# Patient Record
Sex: Female | Born: 1990 | Race: White | Hispanic: No | Marital: Single | Smoking: Never smoker
Health system: Southern US, Community
[De-identification: ages and names within clinical notes are randomized; demographics above are authoritative.]

## PROBLEM LIST (undated history)

## (undated) ENCOUNTER — Inpatient Hospital Stay (HOSPITAL_COMMUNITY): Payer: Self-pay

## (undated) DIAGNOSIS — R1115 Cyclical vomiting syndrome unrelated to migraine: Secondary | ICD-10-CM

## (undated) DIAGNOSIS — F1290 Cannabis use, unspecified, uncomplicated: Secondary | ICD-10-CM

## (undated) DIAGNOSIS — F411 Generalized anxiety disorder: Secondary | ICD-10-CM

## (undated) DIAGNOSIS — F410 Panic disorder [episodic paroxysmal anxiety] without agoraphobia: Secondary | ICD-10-CM

## (undated) HISTORY — PX: NO PAST SURGERIES: SHX2092

---

## 2014-09-10 ENCOUNTER — Ambulatory Visit: Payer: 59 | Admitting: Podiatry

## 2014-12-30 ENCOUNTER — Emergency Department (HOSPITAL_COMMUNITY)
Admission: EM | Admit: 2014-12-30 | Discharge: 2014-12-30 | Disposition: A | Discharge status: Home / Self Care | Payer: 59 | Attending: Emergency Medicine | Admitting: Emergency Medicine

## 2014-12-30 ENCOUNTER — Encounter (HOSPITAL_COMMUNITY): Payer: Self-pay | Admitting: Nurse Practitioner

## 2014-12-30 DIAGNOSIS — Z331 Pregnant state, incidental: Secondary | ICD-10-CM | POA: Diagnosis not present

## 2014-12-30 DIAGNOSIS — K5900 Constipation, unspecified: Secondary | ICD-10-CM | POA: Insufficient documentation

## 2014-12-30 DIAGNOSIS — F419 Anxiety disorder, unspecified: Secondary | ICD-10-CM | POA: Diagnosis not present

## 2014-12-30 DIAGNOSIS — E860 Dehydration: Secondary | ICD-10-CM | POA: Diagnosis not present

## 2014-12-30 DIAGNOSIS — Z79899 Other long term (current) drug therapy: Secondary | ICD-10-CM | POA: Diagnosis not present

## 2014-12-30 DIAGNOSIS — R112 Nausea with vomiting, unspecified: Secondary | ICD-10-CM | POA: Insufficient documentation

## 2014-12-30 HISTORY — DX: Generalized anxiety disorder: F41.1

## 2014-12-30 LAB — URINALYSIS, ROUTINE W REFLEX MICROSCOPIC
Glucose, UA: 100 mg/dL — AB
Hgb urine dipstick: NEGATIVE
Leukocytes, UA: NEGATIVE
NITRITE: NEGATIVE
PH: 7 (ref 5.0–8.0)
Protein, ur: 100 mg/dL — AB
SPECIFIC GRAVITY, URINE: 1.035 — AB (ref 1.005–1.030)
UROBILINOGEN UA: 1 mg/dL (ref 0.0–1.0)

## 2014-12-30 LAB — COMPREHENSIVE METABOLIC PANEL
ALT: 31 U/L (ref 14–54)
ANION GAP: 13 (ref 5–15)
AST: 38 U/L (ref 15–41)
Albumin: 4.6 g/dL (ref 3.5–5.0)
Alkaline Phosphatase: 61 U/L (ref 38–126)
BILIRUBIN TOTAL: 0.9 mg/dL (ref 0.3–1.2)
BUN: 8 mg/dL (ref 6–20)
CALCIUM: 9.8 mg/dL (ref 8.9–10.3)
CHLORIDE: 103 mmol/L (ref 101–111)
CO2: 20 mmol/L — ABNORMAL LOW (ref 22–32)
Creatinine, Ser: 0.78 mg/dL (ref 0.44–1.00)
Glucose, Bld: 186 mg/dL — ABNORMAL HIGH (ref 65–99)
Potassium: 3.5 mmol/L (ref 3.5–5.1)
SODIUM: 136 mmol/L (ref 135–145)
Total Protein: 7.8 g/dL (ref 6.5–8.1)

## 2014-12-30 LAB — CBC WITH DIFFERENTIAL/PLATELET
BASOS ABS: 0 10*3/uL (ref 0.0–0.1)
Basophils Relative: 0 % (ref 0–1)
Eosinophils Absolute: 0 10*3/uL (ref 0.0–0.7)
Eosinophils Relative: 0 % (ref 0–5)
HCT: 39.3 % (ref 36.0–46.0)
Hemoglobin: 14.1 g/dL (ref 12.0–15.0)
LYMPHS ABS: 0.9 10*3/uL (ref 0.7–4.0)
Lymphocytes Relative: 5 % — ABNORMAL LOW (ref 12–46)
MCH: 31.4 pg (ref 26.0–34.0)
MCHC: 35.9 g/dL (ref 30.0–36.0)
MCV: 87.5 fL (ref 78.0–100.0)
Monocytes Absolute: 0.2 10*3/uL (ref 0.1–1.0)
Monocytes Relative: 1 % — ABNORMAL LOW (ref 3–12)
NEUTROS ABS: 17.5 10*3/uL — AB (ref 1.7–7.7)
Neutrophils Relative %: 94 % — ABNORMAL HIGH (ref 43–77)
PLATELETS: 323 10*3/uL (ref 150–400)
RBC: 4.49 MIL/uL (ref 3.87–5.11)
RDW: 12.8 % (ref 11.5–15.5)
WBC: 18.6 10*3/uL — AB (ref 4.0–10.5)

## 2014-12-30 LAB — POC URINE PREG, ED: Preg Test, Ur: POSITIVE — AB

## 2014-12-30 LAB — I-STAT CG4 LACTIC ACID, ED
LACTIC ACID, VENOUS: 2.45 mmol/L — AB (ref 0.5–2.0)
Lactic Acid, Venous: 0.93 mmol/L (ref 0.5–2.0)

## 2014-12-30 LAB — URINE MICROSCOPIC-ADD ON

## 2014-12-30 MED ORDER — SODIUM CHLORIDE 0.9 % IV BOLUS (SEPSIS)
2000.0000 mL | Freq: Once | INTRAVENOUS | Status: AC
  Administered 2014-12-30: 2000 mL via INTRAVENOUS

## 2014-12-30 MED ORDER — ONDANSETRON 4 MG PO TBDP
ORAL_TABLET | ORAL | Status: AC
  Filled 2014-12-30: qty 2

## 2014-12-30 MED ORDER — ONDANSETRON 4 MG PO TBDP
8.0000 mg | ORAL_TABLET | Freq: Once | ORAL | Status: AC
  Administered 2014-12-30: 8 mg via ORAL

## 2014-12-30 MED ORDER — PROMETHAZINE HCL 25 MG PO TABS: 25.0000 mg | ORAL_TABLET | Freq: Four times a day (QID) | ORAL | Status: DC | PRN

## 2014-12-30 NOTE — ED Notes (Signed)
Pt stable, ambulatory, states understanding of discharge instructions 

## 2014-12-30 NOTE — ED Provider Notes (Signed)
CSN: [id number]     Arrival date & time 12/30/14  1526 History   First MD Initiated Contact with Patient 12/30/14 1815     Chief Complaint  Patient presents with  . Emesis     (Consider location/radiation/quality/duration/timing/severity/associated sxs/prior Treatment) HPI  24 year old female presents with severe vomiting since 6 AM. She thinks she has vomited more than 20 times. No blood or bile. No diarrhea, last bowel movement was over 24 hours ago. This is not atypical for her. She has had vomiting at this multiple times over the last 5 months since starting on an SSRI. Every time she seems to get on a new bottle a few days later she seems to get vomiting like this. Had epigastric abdominal pain while vomiting but now that she has stopped vomiting after being given Zofran in the waiting room she has no more pain. No lower abdominal pain or urinary symptoms. Last menstrual period was May 17.  Past Medical History  Diagnosis Date  . Generalized anxiety disorder    History reviewed. No pertinent past surgical history. History reviewed. No pertinent family history. History  Substance Use Topics  . Smoking status: Never Smoker   . Smokeless tobacco: Not on file  . Alcohol Use: No   OB History    No data available     Review of Systems  Constitutional: Negative for fever.  Gastrointestinal: Positive for nausea, vomiting, abdominal pain and constipation. Negative for diarrhea.  Genitourinary: Negative for dysuria and menstrual problem.  Musculoskeletal: Negative for back pain.  All other systems reviewed and are negative.     Allergies  Review of patient's allergies indicates no known allergies.  Home Medications   Prior to Admission medications   Medication Sig Start Date End Date Taking? Authorizing Provider  dimenhyDRINATE (DRAMAMINE) 50 MG tablet Take 50 mg by mouth every 8 (eight) hours as needed for nausea.   Yes Historical Provider, MD  escitalopram (LEXAPRO) 10 MG  tablet Take 10 mg by mouth at bedtime. 12/20/14  Yes Historical Provider, MD  RECLIPSEN 0.15-30 MG-MCG tablet Take 1 tablet by mouth daily. 11/05/14  Yes Historical Provider, MD   BP 159/62 mmHg  Pulse 91  Temp(Src) 98.2 F (36.8 C) (Oral)  Resp 18  SpO2 98% Physical Exam  Constitutional: She is oriented to person, place, and time. She appears well-developed and well-nourished.  HENT:  Head: Normocephalic and atraumatic.  Right Ear: External ear normal.  Left Ear: External ear normal.  Nose: Nose normal.  Dry mucous membranes  Eyes: Right eye exhibits no discharge. Left eye exhibits no discharge.  Cardiovascular: Normal rate, regular rhythm and normal heart sounds.   Pulmonary/Chest: Effort normal and breath sounds normal.  Abdominal: Soft. She exhibits no distension. There is no tenderness.  Neurological: She is alert and oriented to person, place, and time.  Skin: Skin is warm and dry.  Nursing note and vitals reviewed.   ED Course  Procedures (including critical care time) Labs Review Labs Reviewed  COMPREHENSIVE METABOLIC PANEL - Abnormal; Notable for the following:    CO2 20 (*)    Glucose, Bld 186 (*)    All other components within normal limits  CBC WITH DIFFERENTIAL/PLATELET - Abnormal; Notable for the following:    WBC 18.6 (*)    Neutrophils Relative % 94 (*)    Neutro Abs 17.5 (*)    Lymphocytes Relative 5 (*)    Monocytes Relative 1 (*)    All other components within normal limits  URINALYSIS, ROUTINE W REFLEX MICROSCOPIC (NOT AT [facility]) - Abnormal; Notable for the following:    Color, Urine AMBER (*)    APPearance HAZY (*)    Specific Gravity, Urine 1.035 (*)    Glucose, UA 100 (*)    Bilirubin Urine SMALL (*)    Ketones, ur >80 (*)    Protein, ur 100 (*)    All other components within normal limits  URINE MICROSCOPIC-ADD ON - Abnormal; Notable for the following:    Squamous Epithelial / LPF MANY (*)    Bacteria, UA FEW (*)    All other components within  normal limits  POC URINE PREG, ED - Abnormal; Notable for the following:    Preg Test, Ur POSITIVE (*)    All other components within normal limits  I-STAT CG4 LACTIC ACID, ED - Abnormal; Notable for the following:    Lactic Acid, Venous 2.45 (*)    All other components within normal limits  I-STAT CG4 LACTIC ACID, ED    Imaging Review No results found.   EKG Interpretation None      MDM   Final diagnoses:  Nausea and vomiting in adult  Dehydration    Patient with recurrent nausea and vomiting. Had epigastric pain while vomiting but currently has no pain and has not had lower abdominal pain. She is incidentally pregnant, last metro. Was just over one month ago. Given the lower abdominal symptoms and no vaginal bleeding and do not feel that she needs an emergent ultrasound. Her vomiting could be medication related versus gastroenteritis. She has no abdominal distention or tenderness to suggest needing CT or x-ray imaging. I discussed with her risks and benefits of drug use in pregnancy, she is not sure she was to keep the baby and thus wants a prescription for Phenergan. I did advise to start on prenatal vitamins and follow closely with OB/GYN. Initially had mild lactic acid elevation that was likely all dehydration, normalized after IV fluids. No more vomiting and tolerating by mouth here.    Pricilla LovelessScott Chosen Garron, MD 12/31/14 0001

## 2014-12-30 NOTE — ED Notes (Signed)
Pt reports onset vomiting at 0600 this am, unablee to tolerate any oral intake since. She was started on new SSRI 5 days ago. She is c/o abd pain from vomiting. She reports decreased bowel/bladder activity

## 2014-12-30 NOTE — Discharge Instructions (Signed)
Dehydration, Adult Dehydration is when you lose more fluids from the body than you take in. Vital organs like the kidneys, brain, and heart cannot function without a proper amount of fluids and salt. Any loss of fluids from the body can cause dehydration.  CAUSES   Vomiting.  Diarrhea.  Excessive sweating.  Excessive urine output.  Fever. SYMPTOMS  Mild dehydration  Thirst.  Dry lips.  Slightly dry mouth. Moderate dehydration  Very dry mouth.  Sunken eyes.  Skin does not bounce back quickly when lightly pinched and released.  Dark urine and decreased urine production.  Decreased tear production.  Headache. Severe dehydration  Very dry mouth.  Extreme thirst.  Rapid, weak pulse (more than 100 beats per minute at rest).  Cold hands and feet.  Not able to sweat in spite of heat and temperature.  Rapid breathing.  Blue lips.  Confusion and lethargy.  Difficulty being awakened.  Minimal urine production.  No tears. DIAGNOSIS  Your caregiver will diagnose dehydration based on your symptoms and your exam. Blood and urine tests will help confirm the diagnosis. The diagnostic evaluation should also identify the cause of dehydration. TREATMENT  Treatment of mild or moderate dehydration can often be done at home by increasing the amount of fluids that you drink. It is best to drink small amounts of fluid more often. Drinking too much at one time can make vomiting worse. Refer to the home care instructions below. Severe dehydration needs to be treated at the hospital where you will probably be given intravenous (IV) fluids that contain water and electrolytes. HOME CARE INSTRUCTIONS   Ask your caregiver about specific rehydration instructions.  Drink enough fluids to keep your urine clear or pale yellow.  Drink small amounts frequently if you have nausea and vomiting.  Eat as you normally do.  Avoid:  Foods or drinks high in sugar.  Carbonated  drinks.  Juice.  Extremely hot or cold fluids.  Drinks with caffeine.  Fatty, greasy foods.  Alcohol.  Tobacco.  Overeating.  Gelatin desserts.  Wash your hands well to avoid spreading bacteria and viruses.  Only take over-the-counter or prescription medicines for pain, discomfort, or fever as directed by your caregiver.  Ask your caregiver if you should continue all prescribed and over-the-counter medicines.  Keep all follow-up appointments with your caregiver. SEEK MEDICAL CARE IF:  You have abdominal pain and it increases or stays in one area (localizes).  You have a rash, stiff neck, or severe headache.  You are irritable, sleepy, or difficult to awaken.  You are weak, dizzy, or extremely thirsty. SEEK IMMEDIATE MEDICAL CARE IF:   You are unable to keep fluids down or you get worse despite treatment.  You have frequent episodes of vomiting or diarrhea.  You have blood or green matter (bile) in your vomit.  You have blood in your stool or your stool looks black and tarry.  You have not urinated in 6 to 8 hours, or you have only urinated a small amount of very dark urine.  You have a fever.  You faint. MAKE SURE YOU:   Understand these instructions.  Will watch your condition.  Will get help right away if you are not doing well or get worse. Document Released: 06/20/2005 Document Revised: 09/12/2011 Document Reviewed: 02/07/2011 [employer] Patient Information 2015 [employer]. This information is not intended to replace advice given to you by your health care provider. Make sure you discuss any questions you have with your health care   provider.  Nausea and Vomiting Nausea is a sick feeling that often comes before throwing up (vomiting). Vomiting is a reflex where stomach contents come out of your mouth. Vomiting can cause severe loss of body fluids (dehydration). Children and elderly adults can become dehydrated quickly, especially if they also have  diarrhea. Nausea and vomiting are symptoms of a condition or disease. It is important to find the cause of your symptoms. CAUSES   Direct irritation of the stomach lining. This irritation can result from increased acid production (gastroesophageal reflux disease), infection, food poisoning, taking certain medicines (such as nonsteroidal anti-inflammatory drugs), alcohol use, or tobacco use.  Signals from the brain.These signals could be caused by a headache, heat exposure, an inner ear disturbance, increased pressure in the brain from injury, infection, a tumor, or a concussion, pain, emotional stimulus, or metabolic problems.  An obstruction in the gastrointestinal tract (bowel obstruction).  Illnesses such as diabetes, hepatitis, gallbladder problems, appendicitis, kidney problems, cancer, sepsis, atypical symptoms of a heart attack, or eating disorders.  Medical treatments such as chemotherapy and radiation.  Receiving medicine that makes you sleep (general anesthetic) during surgery. DIAGNOSIS Your caregiver may ask for tests to be done if the problems do not improve after a few days. Tests may also be done if symptoms are severe or if the reason for the nausea and vomiting is not clear. Tests may include:  Urine tests.  Blood tests.  Stool tests.  Cultures (to look for evidence of infection).  X-rays or other imaging studies. Test results can help your caregiver make decisions about treatment or the need for additional tests. TREATMENT You need to stay well hydrated. Drink frequently but in small amounts.You may wish to drink water, sports drinks, clear broth, or eat frozen ice pops or gelatin dessert to help stay hydrated.When you eat, eating slowly may help prevent nausea.There are also some antinausea medicines that may help prevent nausea. HOME CARE INSTRUCTIONS   Take all medicine as directed by your caregiver.  If you do not have an appetite, do not force yourself to  eat. However, you must continue to drink fluids.  If you have an appetite, eat a normal diet unless your caregiver tells you differently.  Eat a variety of complex carbohydrates (rice, wheat, potatoes, bread), lean meats, yogurt, fruits, and vegetables.  Avoid high-fat foods because they are more difficult to digest.  Drink enough water and fluids to keep your urine clear or pale yellow.  If you are dehydrated, ask your caregiver for specific rehydration instructions. Signs of dehydration may include:  Severe thirst.  Dry lips and mouth.  Dizziness.  Dark urine.  Decreasing urine frequency and amount.  Confusion.  Rapid breathing or pulse. SEEK IMMEDIATE MEDICAL CARE IF:   You have blood or brown flecks (like coffee grounds) in your vomit.  You have black or bloody stools.  You have a severe headache or stiff neck.  You are confused.  You have severe abdominal pain.  You have chest pain or trouble breathing.  You do not urinate at least once every 8 hours.  You develop cold or clammy skin.  You continue to vomit for longer than 24 to 48 hours.  You have a fever. MAKE SURE YOU:   Understand these instructions.  Will watch your condition.  Will get help right away if you are not doing well or get worse. Document Released: 06/20/2005 Document Revised: 09/12/2011 Document Reviewed: 11/17/2010 Exit[employer] Patient Information 2015 [employer]. This information   is not intended to replace advice given to you by your health care provider. Make sure you discuss any questions you have with your health care provider.  

## 2015-01-01 ENCOUNTER — Emergency Department (HOSPITAL_COMMUNITY): Payer: 59

## 2015-01-01 ENCOUNTER — Emergency Department (HOSPITAL_COMMUNITY)
Admission: EM | Admit: 2015-01-01 | Discharge: 2015-01-01 | Disposition: A | Discharge status: Home / Self Care | Payer: 59 | Attending: Emergency Medicine | Admitting: Emergency Medicine

## 2015-01-01 ENCOUNTER — Encounter (HOSPITAL_COMMUNITY): Payer: Self-pay | Admitting: Emergency Medicine

## 2015-01-01 DIAGNOSIS — O99611 Diseases of the digestive system complicating pregnancy, first trimester: Secondary | ICD-10-CM | POA: Diagnosis present

## 2015-01-01 DIAGNOSIS — Z793 Long term (current) use of hormonal contraceptives: Secondary | ICD-10-CM | POA: Insufficient documentation

## 2015-01-01 DIAGNOSIS — F411 Generalized anxiety disorder: Secondary | ICD-10-CM | POA: Diagnosis not present

## 2015-01-01 DIAGNOSIS — K5900 Constipation, unspecified: Secondary | ICD-10-CM | POA: Insufficient documentation

## 2015-01-01 DIAGNOSIS — Z3A01 Less than 8 weeks gestation of pregnancy: Secondary | ICD-10-CM | POA: Insufficient documentation

## 2015-01-01 DIAGNOSIS — R109 Unspecified abdominal pain: Secondary | ICD-10-CM

## 2015-01-01 DIAGNOSIS — Z3490 Encounter for supervision of normal pregnancy, unspecified, unspecified trimester: Secondary | ICD-10-CM

## 2015-01-01 DIAGNOSIS — O99341 Other mental disorders complicating pregnancy, first trimester: Secondary | ICD-10-CM | POA: Diagnosis not present

## 2015-01-01 DIAGNOSIS — R1030 Lower abdominal pain, unspecified: Secondary | ICD-10-CM

## 2015-01-01 LAB — WET PREP, GENITAL
Trich, Wet Prep: NONE SEEN
Yeast Wet Prep HPF POC: NONE SEEN

## 2015-01-01 LAB — COMPREHENSIVE METABOLIC PANEL
ALBUMIN: 4.4 g/dL (ref 3.5–5.0)
ALT: 25 U/L (ref 14–54)
AST: 30 U/L (ref 15–41)
Alkaline Phosphatase: 53 U/L (ref 38–126)
Anion gap: 12 (ref 5–15)
BILIRUBIN TOTAL: 0.9 mg/dL (ref 0.3–1.2)
BUN: 6 mg/dL (ref 6–20)
CHLORIDE: 104 mmol/L (ref 101–111)
CO2: 20 mmol/L — AB (ref 22–32)
CREATININE: 0.71 mg/dL (ref 0.44–1.00)
Calcium: 9.9 mg/dL (ref 8.9–10.3)
GFR calc non Af Amer: >60 mL/min (ref >60)
GLUCOSE: 112 mg/dL — AB (ref 65–99)
POTASSIUM: 3.3 mmol/L — AB (ref 3.5–5.1)
Sodium: 136 mmol/L (ref 135–145)
TOTAL PROTEIN: 7 g/dL (ref 6.5–8.1)

## 2015-01-01 LAB — ABO/RH: ABO/RH(D): O POS

## 2015-01-01 LAB — URINALYSIS, ROUTINE W REFLEX MICROSCOPIC
Bilirubin Urine: NEGATIVE
Glucose, UA: NEGATIVE mg/dL
Hgb urine dipstick: NEGATIVE
Ketones, ur: 40 mg/dL — AB
LEUKOCYTES UA: NEGATIVE
Nitrite: NEGATIVE
Protein, ur: NEGATIVE mg/dL
SPECIFIC GRAVITY, URINE: 1.009 (ref 1.005–1.030)
Urobilinogen, UA: 0.2 mg/dL (ref 0.0–1.0)
pH: 7 (ref 5.0–8.0)

## 2015-01-01 LAB — HIV ANTIBODY (ROUTINE TESTING W REFLEX): HIV SCREEN 4TH GENERATION: NONREACTIVE

## 2015-01-01 LAB — CBC WITH DIFFERENTIAL/PLATELET
BASOS PCT: 0 % (ref 0–1)
Basophils Absolute: 0 10*3/uL (ref 0.0–0.1)
EOS ABS: 0 10*3/uL (ref 0.0–0.7)
Eosinophils Relative: 0 % (ref 0–5)
HCT: 36.2 % (ref 36.0–46.0)
Hemoglobin: 12.9 g/dL (ref 12.0–15.0)
LYMPHS ABS: 2.3 10*3/uL (ref 0.7–4.0)
LYMPHS PCT: 14 % (ref 12–46)
MCH: 31.5 pg (ref 26.0–34.0)
MCHC: 35.6 g/dL (ref 30.0–36.0)
MCV: 88.3 fL (ref 78.0–100.0)
MONO ABS: 0.9 10*3/uL (ref 0.1–1.0)
Monocytes Relative: 5 % (ref 3–12)
Neutro Abs: 13.5 10*3/uL — ABNORMAL HIGH (ref 1.7–7.7)
Neutrophils Relative %: 81 % — ABNORMAL HIGH (ref 43–77)
Platelets: 284 10*3/uL (ref 150–400)
RBC: 4.1 MIL/uL (ref 3.87–5.11)
RDW: 13 % (ref 11.5–15.5)
WBC: 16.7 10*3/uL — AB (ref 4.0–10.5)

## 2015-01-01 LAB — HCG, QUANTITATIVE, PREGNANCY: hCG, Beta Chain, Quant, S: 19717 m[IU]/mL — ABNORMAL HIGH (ref <5)

## 2015-01-01 LAB — LIPASE, BLOOD: LIPASE: 23 U/L (ref 22–51)

## 2015-01-01 MED ORDER — HYDROCODONE-ACETAMINOPHEN 5-325 MG PO TABS: 1.0000 | ORAL_TABLET | ORAL | Status: DC | PRN

## 2015-01-01 MED ORDER — GI COCKTAIL ~~LOC~~
30.0000 mL | Freq: Once | ORAL | Status: AC
  Administered 2015-01-01: 30 mL via ORAL
  Filled 2015-01-01: qty 30

## 2015-01-01 MED ORDER — HYDROMORPHONE HCL 1 MG/ML IJ SOLN
0.5000 mg | Freq: Once | INTRAMUSCULAR | Status: AC
  Administered 2015-01-01: 0.5 mg via INTRAVENOUS
  Filled 2015-01-01: qty 1

## 2015-01-01 MED ORDER — PROMETHAZINE HCL 25 MG/ML IJ SOLN
12.5000 mg | Freq: Once | INTRAMUSCULAR | Status: AC
  Administered 2015-01-01: 12.5 mg via INTRAVENOUS
  Filled 2015-01-01: qty 1

## 2015-01-01 MED ORDER — SODIUM CHLORIDE 0.9 % IV BOLUS (SEPSIS)
1000.0000 mL | INTRAVENOUS | Status: AC
  Administered 2015-01-01: 1000 mL via INTRAVENOUS

## 2015-01-01 MED ORDER — ONDANSETRON HCL 4 MG/2ML IJ SOLN
4.0000 mg | Freq: Once | INTRAMUSCULAR | Status: AC
  Administered 2015-01-01: 4 mg via INTRAVENOUS
  Filled 2015-01-01: qty 2

## 2015-01-01 MED ORDER — HYDROMORPHONE HCL 1 MG/ML IJ SOLN
1.0000 mg | Freq: Once | INTRAMUSCULAR | Status: AC
  Administered 2015-01-01: 1 mg via INTRAVENOUS
  Filled 2015-01-01: qty 1

## 2015-01-01 MED ORDER — FLEET ENEMA 7-19 GM/118ML RE ENEM
1.0000 | ENEMA | Freq: Once | RECTAL | Status: AC
  Administered 2015-01-01: 1 via RECTAL
  Filled 2015-01-01: qty 1

## 2015-01-01 NOTE — Discharge Instructions (Signed)
Start with a clear liquid diet. Gradually advance your diet. Take Colace twice a day, stool softener, for 2-3 weeks, then as needed.    Abdominal Pain, Women Abdominal (stomach, pelvic, or belly) pain can be caused by many things. It is important to tell your doctor:  The location of the pain.  Does it come and go or is it present all the time?  Are there things that start the pain (eating certain foods, exercise)?  Are there other symptoms associated with the pain (fever, nausea, vomiting, diarrhea)? All of this is helpful to know when trying to find the cause of the pain. CAUSES   Stomach: virus or bacteria infection, or ulcer.  Intestine: appendicitis (inflamed appendix), regional ileitis (Crohn's disease), ulcerative colitis (inflamed colon), irritable bowel syndrome, diverticulitis (inflamed diverticulum of the colon), or cancer of the stomach or intestine.  Gallbladder disease or stones in the gallbladder.  Kidney disease, kidney stones, or infection.  Pancreas infection or cancer.  Fibromyalgia (pain disorder).  Diseases of the female organs:  Uterus: fibroid (non-cancerous) tumors or infection.  Fallopian tubes: infection or tubal pregnancy.  Ovary: cysts or tumors.  Pelvic adhesions (scar tissue).  Endometriosis (uterus lining tissue growing in the pelvis and on the pelvic organs).  Pelvic congestion syndrome (female organs filling up with blood just before the menstrual period).  Pain with the menstrual period.  Pain with ovulation (producing an egg).  Pain with an IUD (intrauterine device, birth control) in the uterus.  Cancer of the female organs.  Functional pain (pain not caused by a disease, may improve without treatment).  Psychological pain.  Depression. DIAGNOSIS  Your doctor will decide the seriousness of your pain by doing an examination.  Blood tests.  X-rays.  Ultrasound.  CT scan (computed tomography, special type of  X-ray).  MRI (magnetic resonance imaging).  Cultures, for infection.  Barium enema (dye inserted in the large intestine, to better view it with X-rays).  Colonoscopy (looking in intestine with a lighted tube).  Laparoscopy (minor surgery, looking in abdomen with a lighted tube).  Major abdominal exploratory surgery (looking in abdomen with a large incision). TREATMENT  The treatment will depend on the cause of the pain.   Many cases can be observed and treated at home.  Over-the-counter medicines recommended by your caregiver.  Prescription medicine.  Antibiotics, for infection.  Birth control pills, for painful periods or for ovulation pain.  Hormone treatment, for endometriosis.  Nerve blocking injections.  Physical therapy.  Antidepressants.  Counseling with a psychologist or psychiatrist.  Minor or major surgery. HOME CARE INSTRUCTIONS   Do not take laxatives, unless directed by your caregiver.  Take over-the-counter pain medicine only if ordered by your caregiver. Do not take aspirin because it can cause an upset stomach or bleeding.  Try a clear liquid diet (broth or water) as ordered by your caregiver. Slowly move to a bland diet, as tolerated, if the pain is related to the stomach or intestine.  Have a thermometer and take your temperature several times a day, and record it.  Bed rest and sleep, if it helps the pain.  Avoid sexual intercourse, if it causes pain.  Avoid stressful situations.  Keep your follow-up appointments and tests, as your caregiver orders.  If the pain does not go away with medicine or surgery, you may try:  Acupuncture.  Relaxation exercises (yoga, meditation).  Group therapy.  Counseling. SEEK MEDICAL CARE IF:   You notice certain foods cause stomach pain.  Your home care treatment is not helping your pain.  You need stronger pain medicine.  You want your IUD removed.  You feel faint or lightheaded.  You  develop nausea and vomiting.  You develop a rash.  You are having side effects or an allergy to your medicine. SEEK IMMEDIATE MEDICAL CARE IF:   Your pain does not go away or gets worse.  You have a fever.  Your pain is felt only in portions of the abdomen. The right side could possibly be appendicitis. The left lower portion of the abdomen could be colitis or diverticulitis.  You are passing blood in your stools (bright red or black tarry stools, with or without vomiting).  You have blood in your urine.  You develop chills, with or without a fever.  You pass out. MAKE SURE YOU:   Understand these instructions.  Will watch your condition.  Will get help right away if you are not doing well or get worse. Document Released: 04/17/2007 Document Revised: 11/04/2013 Document Reviewed: 05/07/2009 [facility] Patient Information 2015 [place], [place]. This information is not intended to replace advice given to you by your health care provider. Make sure you discuss any questions you have with your health care provider.  Constipation Constipation is when a person has fewer than three bowel movements a week, has difficulty having a bowel movement, or has stools that are dry, hard, or larger than normal. As people grow older, constipation is more common. If you try to fix constipation with medicines that make you have a bowel movement (laxatives), the problem may get worse. Long-term laxative use may cause the muscles of the colon to become weak. A low-fiber diet, not taking in enough fluids, and taking certain medicines may make constipation worse.  CAUSES   Certain medicines, such as antidepressants, pain medicine, iron supplements, antacids, and water pills.   Certain diseases, such as diabetes, irritable bowel syndrome (IBS), thyroid disease, or depression.   Not drinking enough water.   Not eating enough fiber-rich foods.   Stress or travel.   Lack of physical activity or  exercise.   Ignoring the urge to have a bowel movement.   Using laxatives too much.  SIGNS AND SYMPTOMS   Having fewer than three bowel movements a week.   Straining to have a bowel movement.   Having stools that are hard, dry, or larger than normal.   Feeling full or bloated.   Pain in the lower abdomen.   Not feeling relief after having a bowel movement.  DIAGNOSIS  Your health care provider will take a medical history and perform a physical exam. Further testing may be done for severe constipation. Some tests may include:  A barium enema X-ray to examine your rectum, colon, and, sometimes, your small intestine.   A sigmoidoscopy to examine your lower colon.   A colonoscopy to examine your entire colon. TREATMENT  Treatment will depend on the severity of your constipation and what is causing it. Some dietary treatments include drinking more fluids and eating more fiber-rich foods. Lifestyle treatments may include regular exercise. If these diet and lifestyle recommendations do not help, your health care provider may recommend taking over-the-counter laxative medicines to help you have bowel movements. Prescription medicines may be prescribed if over-the-counter medicines do not work.  HOME CARE INSTRUCTIONS   Eat foods that have a lot of fiber, such as fruits, vegetables, whole grains, and beans.  Limit foods high in fat and processed sugars, such as french  fries, hamburgers, cookies, candies, and soda.   A fiber supplement may be added to your diet if you cannot get enough fiber from foods.   Drink enough fluids to keep your urine clear or pale yellow.   Exercise regularly or as directed by your health care provider.   Go to the restroom when you have the urge to go. Do not hold it.   Only take over-the-counter or prescription medicines as directed by your health care provider. Do not take other medicines for constipation without talking to your health  care provider first.  SEEK IMMEDIATE MEDICAL CARE IF:   You have bright red blood in your stool.   Your constipation lasts for more than 4 days or gets worse.   You have abdominal or rectal pain.   You have thin, pencil-like stools.   You have unexplained weight loss. MAKE SURE YOU:   Understand these instructions.  Will watch your condition.  Will get help right away if you are not doing well or get worse. Document Released: 03/18/2004 Document Revised: 06/25/2013 Document Reviewed: 04/01/2013 [facility] Patient Information 2015 [place]. This information is not intended to replace advice given to you by your health care provider. Make sure you discuss any questions you have with your health care provider.

## 2015-01-01 NOTE — ED Provider Notes (Signed)
CSN: [id number]     Arrival date & time 01/01/15  0759 History   First MD Initiated Contact with Patient 01/01/15 0801     Chief Complaint  Patient presents with  . Abdominal Pain     (Consider location/radiation/quality/duration/timing/severity/associated sxs/prior Treatment) Patient is a 24 y.o. female presenting with abdominal pain. The history is provided by the patient.  Abdominal Pain Pain location:  Periumbilical Pain quality: cramping   Pain radiates to:  Does not radiate Pain severity:  Severe Onset quality:  Sudden Duration:  3 hours Timing:  Constant Progression:  Waxing and waning Chronicity:  Recurrent Context comment:  Constipation Relieved by:  Nothing Worsened by:  Nothing tried Ineffective treatments:  None tried Associated symptoms: constipation, nausea and vomiting   Associated symptoms: no chest pain, no cough, no diarrhea, no dysuria, no fatigue, no fever, no hematuria and no shortness of breath   Associated symptoms comment:  Belching   Past Medical History  Diagnosis Date  . Generalized anxiety disorder    History reviewed. No pertinent past surgical history. History reviewed. No pertinent family history. History  Substance Use Topics  . Smoking status: Never Smoker   . Smokeless tobacco: Not on file  . Alcohol Use: No   OB History    No data available     Review of Systems  Constitutional: Negative for fever and fatigue.  HENT: Negative for congestion and drooling.   Eyes: Negative for pain.  Respiratory: Negative for cough and shortness of breath.   Cardiovascular: Negative for chest pain.  Gastrointestinal: Positive for nausea, vomiting, abdominal pain and constipation. Negative for diarrhea.  Genitourinary: Negative for dysuria and hematuria.  Musculoskeletal: Negative for back pain, gait problem and neck pain.  Skin: Negative for color change.  Neurological: Negative for dizziness and headaches.  Hematological: Negative for  adenopathy.  Psychiatric/Behavioral: Negative for behavioral problems.  All other systems reviewed and are negative.     Allergies  Review of patient's allergies indicates no known allergies.  Home Medications   Prior to Admission medications   Medication Sig Start Date End Date Taking? Authorizing Provider  dimenhyDRINATE (DRAMAMINE) 50 MG tablet Take 50 mg by mouth every 8 (eight) hours as needed for nausea.    Historical Provider, MD  escitalopram (LEXAPRO) 10 MG tablet Take 10 mg by mouth at bedtime. 12/20/14   Historical Provider, MD  promethazine (PHENERGAN) 25 MG tablet Take 1 tablet (25 mg total) by mouth every 6 (six) hours as needed for nausea or vomiting. 12/30/14   Pricilla Loveless, MD  RECLIPSEN 0.15-30 MG-MCG tablet Take 1 tablet by mouth daily. 11/05/14   Historical Provider, MD   Ht 5\' 3"  (1.6 m)  Wt 110 lb (49.896 kg)  BMI 19.49 kg/m2  LMP 11/18/2014 Physical Exam  Constitutional: She is oriented to person, place, and time. She appears well-developed and well-nourished.  HENT:  Head: Normocephalic.  Mouth/Throat: Oropharynx is clear and moist. No oropharyngeal exudate.  Eyes: Conjunctivae and EOM are normal. Pupils are equal, round, and reactive to light.  Neck: Normal range of motion. Neck supple.  Cardiovascular: Normal rate, regular rhythm, normal heart sounds and intact distal pulses.  Exam reveals no gallop and no friction rub.   No murmur heard. Pulmonary/Chest: Effort normal and breath sounds normal. No respiratory distress. She has no wheezes.  Abdominal: Soft. Bowel sounds are normal. There is no tenderness. There is no rebound and no guarding.  Genitourinary:  Normal-appearing external vagina.  Normal-appearing cervix. Os closed.  Small amount of clear fluid in the posterior fornix.  No cervical motion tenderness or adnexal tenderness during the bimanual exam.  Musculoskeletal: Normal range of motion. She exhibits no edema or tenderness.  Neurological: She  is alert and oriented to person, place, and time.  Skin: Skin is warm and dry.  Psychiatric: She has a normal mood and affect. Her behavior is normal.  Nursing note and vitals reviewed.   ED Course  Procedures (including critical care time) Labs Review Labs Reviewed  WET PREP, GENITAL - Abnormal; Notable for the following:    Clue Cells Wet Prep HPF POC TOO NUMEROUS TO COUNT (*)    WBC, Wet Prep HPF POC TOO NUMEROUS TO COUNT (*)    All other components within normal limits  CBC WITH DIFFERENTIAL/PLATELET - Abnormal; Notable for the following:    WBC 16.7 (*)    Neutrophils Relative % 81 (*)    Neutro Abs 13.5 (*)    All other components within normal limits  COMPREHENSIVE METABOLIC PANEL - Abnormal; Notable for the following:    Potassium 3.3 (*)    CO2 20 (*)    Glucose, Bld 112 (*)    All other components within normal limits  URINALYSIS, ROUTINE W REFLEX MICROSCOPIC (NOT AT [facility]) - Abnormal; Notable for the following:    Ketones, ur 40 (*)    All other components within normal limits  HCG, QUANTITATIVE, PREGNANCY - Abnormal; Notable for the following:    hCG, Beta Chain, Quant, S 19717 (*)    All other components within normal limits  LIPASE, BLOOD  HIV ANTIBODY (ROUTINE TESTING)  ABO/RH  GC/CHLAMYDIA PROBE AMP ([place]) NOT AT [facility]    Imaging Review Mr Abdomen Wo Contrast  01/01/2015   CLINICAL DATA:  Evaluate for appendicitis. Early gestation pregnancy. Presents today with continued/worsening pain in peri umbilical area.  EXAM: MRI ABDOMEN WITHOUT CONTRAST  TECHNIQUE: Multiplanar multisequence MR imaging was performed without the administration of intravenous contrast.  COMPARISON:  None  FINDINGS: Note: Exam incomplete. The patient became nauseous and vomited toward end of scan and did not want to continue further.  Lower chest:  No pleural effusion identified  Hepatobiliary: The visualized portions of the liver appear normal. The gallbladder is unremarkable. No  biliary dilatation.  Pancreas: Normal appearance of the pancreas.  Spleen: The visualized portions of the spleen are normal.  Adrenals/Urinary Tract: The adrenal glands are unremarkable. Both kidneys appear normal. No obstructive uropathy. Urinary bladder appears normal.  Stomach/Bowel: The stomach is within normal limits. The small bowel loops have a normal course and caliber. No obstruction. Normal appearance of the colon. The appendix is not visualized separate from the right lower quadrant bowel loops. There are no secondary signs of acute appendicitis.  Vascular/Lymphatic: Normal appearance of the abdominal aorta. No enlarged upper abdominal lymph nodes. No pelvic or inguinal lymph nodes.  Reproductive: Gravid uterus compatible with first-trimester gestation.  Other: There is a small amount of free fluid noted within the pelvis.  Musculoskeletal: Normal signal from within the bone marrow.  IMPRESSION: 1. Exam detail diminished. The patient became nauseous and vomited toward end of scan and did not want to continue further. 2. Nonvisualization of the appendix. 3. Small amount of free fluid noted within the pelvis. 4. Gravid uterus consistent with appearance compatible with early gestation.   Electronically Signed   By: Signa Kellaylor  Stroud M.D.   On: 01/01/2015 18:58   [place] Ob Comp Less 14 Wks  01/01/2015  CLINICAL DATA:  Lower abdominal pain, positive pregnancy test, last normal menstrual period Nov 18, 2014.  EXAM: OBSTETRIC <14 WK [place] AND TRANSVAGINAL OB US  TECHNIQUE: Both transabdominal and transvaginal ultrasound examinations were performed for complete evaluation of the gestation as well as the maternal uterus, adnexal regions, and pelvic cul-de-sac. Transvaginal technique was performed to assess early pregnancy.  COMPARISON:  None.  FINDINGS: Intrauterine gestational sac: Single common normal in shape  Yolk sac:  Present  Embryo:  Present  Cardiac Activity: Present  Heart Rate: 116  bpm  CRL:  3.5  mm   6 w    0 d                  [place] EDC: August 27, 2015  Maternal uterus/adnexae: No subchorionic hemorrhage is observed. The maternal ovaries are unremarkable.  IMPRESSION: There is a normal appearing early IUP with estimated gestational age of [redacted] weeks 0 days with estimated date of confinement of August 27, 2015.   Electronically Signed   By: David  [place] M.D.   On: 01/01/2015 11:08   US Ob Transvaginal  01/01/2015   CLINICAL DATA:  Lower abdominal pain, positive pregnancy test, last normal menstrual period Nov 18, 2014.  EXAM: OBSTETRIC <14 WK [place] AND TRANSVAGINAL OB US  TECHNIQUE: Both transabdominal and transvaginal ultrasound examinations were performed for complete evaluation of the gestation as well as the maternal uterus, adnexal regions, and pelvic cul-de-sac. Transvaginal technique was performed to assess early pregnancy.  COMPARISON:  None.  FINDINGS: Intrauterine gestational sac: Single common normal in shape  Yolk sac:  Present  Embryo:  Present  Cardiac Activity: Present  Heart Rate: 116  bpm  CRL:  3.5  mm   6 w   0 d                  [place] EDC: August 27, 2015  Maternal uterus/adnexae: No subchorionic hemorrhage is observed. The maternal ovaries are unremarkable.  IMPRESSION: There is a normal appearing early IUP with estimated gestational age of [redacted] weeks 0 days with estimated date of confinement of August 27, 2015.   Electronically Signed   By: David  [place] M.D.   On: 01/01/2015 11:08     EKG Interpretation None      MDM   Final diagnoses:  Abdominal pain, unspecified abdominal location  Constipation, unspecified constipation type  Pregnancy    8:25 AM 24 y.o. female G1P0 w hx of anxiety, constipation who presents with nausea, vomiting, and periumbilical abdominal pain which began around 6 AM this morning. She states that she has not had a bowel movement 3-4 days. She has a history of 2 similar episodes earlier this year. She thinks that she has IBS. She was seen in the ER 2 days ago  with similar symptoms and was incidentally found to be pregnant at that time. Her workup was noncontributory. She states that she felt fine yesterday. She had recurrence of symptoms this morning. She denies any fevers. Vital signs are unremarkable here. She is very anxious on exam and describes her abdominal pain as cramping. Her abdomen is soft and benign. I do not think this is a ruptured ectopic. She denies GU sx. No vag bleeding. Will repeat screening lab work, IV fluids. We'll plan on broadening workup with pelvic exam and pelvic ultrasound.   I informed the patient that certain anti-medics and opiates may be teratogenic early on in pregnancy. She understands and would like antiemetics and pain  medicine anyway. She states that she plans to terminate the pregnancy.  Workup thus far non-contrib. Pt continues to have N/V and severe periumbilical pain. Normal pelvic exam. Doubt PID. Pt also notes similar episodes w/ constipation in the past. She was given an enema and had a bm here w/ only mild relief. Will get MRI of abd and continue symptomatic tx. Dr. Effie Shy to f/u on MRI.    Purvis Sheffield, MD 01/02/15 1118

## 2015-01-01 NOTE — ED Notes (Signed)
Notified MD that pt is having more pain/nausea

## 2015-01-01 NOTE — ED Provider Notes (Signed)
20:50 MRI has returned and is not completely diagnostic, but there are no overt signs of acute intra-abdominal abnormalities.  At this time, she denies having abdominal pain, nausea or vomiting. She has tolerated about 8 ounces of water in the last 30 minutes. She relates that she has ongoing chronic intermittent constipation. She usually has a bowel movement every third to fourth day. She has not used any stool softeners at home in the last several days. She denies fever, chills, cough or chest pain at this time.  Assessment: Nonspecific abdominal pain with constipation. Doubt pregnancy complication, appendicitis, colitis, metabolic instability or serious bacterial infection.  Plan: Start home with gradual diet advancement, initiating with clear liquids. Start stool softener, Colace, twice a day, until having regular soft stools. Referral to GI here in [place], or [place] in [place], [place]. Findings were discussed with patient and family members, the time of discharge. Given discharge prescription of Norco, but advised that she needs to minimize its use as it will likely worsen constipation. She had filled a prescription for Phenergan earlier today.   Results for orders placed or performed during the hospital encounter of 01/01/15  Wet prep, genital  Result Value Ref Range   Yeast Wet Prep HPF POC NONE SEEN NONE SEEN   Trich, Wet Prep NONE SEEN NONE SEEN   Clue Cells Wet Prep HPF POC TOO NUMEROUS TO COUNT (A) NONE SEEN   WBC, Wet Prep HPF POC TOO NUMEROUS TO COUNT (A) NONE SEEN  CBC with Differential/Platelet  Result Value Ref Range   WBC 16.7 (H) 4.0 - 10.5 K/uL   RBC 4.10 3.87 - 5.11 MIL/uL   Hemoglobin 12.9 12.0 - 15.0 g/dL   HCT 16.136.2 09.636.0 - 04.546.0 %   MCV 88.3 78.0 - 100.0 fL   MCH 31.5 26.0 - 34.0 pg   MCHC 35.6 30.0 - 36.0 g/dL   RDW 40.913.0 81.111.5 - 91.415.5 %   Platelets 284 150 - 400 K/uL   Neutrophils Relative % 81 (H) 43 - 77 %   Neutro Abs 13.5 (H) 1.7 - 7.7 K/uL   Lymphocytes Relative 14 12 - 46 %   Lymphs Abs 2.3 0.7 - 4.0 K/uL   Monocytes Relative 5 3 - 12 %   Monocytes Absolute 0.9 0.1 - 1.0 K/uL   Eosinophils Relative 0 0 - 5 %   Eosinophils Absolute 0.0 0.0 - 0.7 K/uL   Basophils Relative 0 0 - 1 %   Basophils Absolute 0.0 0.0 - 0.1 K/uL  Comprehensive metabolic panel  Result Value Ref Range   Sodium 136 135 - 145 mmol/L   Potassium 3.3 (L) 3.5 - 5.1 mmol/L   Chloride 104 101 - 111 mmol/L   CO2 20 (L) 22 - 32 mmol/L   Glucose, Bld 112 (H) 65 - 99 mg/dL   BUN 6 6 - 20 mg/dL   Creatinine, Ser 7.820.71 0.44 - 1.00 mg/dL   Calcium 9.9 8.9 - 95.610.3 mg/dL   Total Protein 7.0 6.5 - 8.1 g/dL   Albumin 4.4 3.5 - 5.0 g/dL   AST 30 15 - 41 U/L   ALT 25 14 - 54 U/L   Alkaline Phosphatase 53 38 - 126 U/L   Total Bilirubin 0.9 0.3 - 1.2 mg/dL   GFR calc non Af Amer >60 >60 mL/min   GFR calc Af Amer >60 >60 mL/min   Anion gap 12 5 - 15  Lipase, blood  Result Value Ref Range   Lipase 23  22 - 51 U/L  Urinalysis, Routine w reflex microscopic (not at [facility])  Result Value Ref Range   Color, Urine YELLOW YELLOW   APPearance CLEAR CLEAR   Specific Gravity, Urine 1.009 1.005 - 1.030   pH 7.0 5.0 - 8.0   Glucose, UA NEGATIVE NEGATIVE mg/dL   Hgb urine dipstick NEGATIVE NEGATIVE   Bilirubin Urine NEGATIVE NEGATIVE   Ketones, ur 40 (A) NEGATIVE mg/dL   Protein, ur NEGATIVE NEGATIVE mg/dL   Urobilinogen, UA 0.2 0.0 - 1.0 mg/dL   Nitrite NEGATIVE NEGATIVE   Leukocytes, UA NEGATIVE NEGATIVE  hCG, quantitative, pregnancy  Result Value Ref Range   hCG, Beta Chain, Quant, S 19717 (H) <5 mIU/mL  HIV antibody  Result Value Ref Range   HIV Screen 4th Generation wRfx Non Reactive Non Reactive  ABO/Rh  Result Value Ref Range   ABO/RH(D) O POS    No rh immune globuloin NOT A RH IMMUNE GLOBULIN CANDIDATE, PT RH POSITIVE     Mr Abdomen Wo Contrast  01/01/2015   CLINICAL DATA:  Evaluate for appendicitis. Early gestation pregnancy. Presents today with  continued/worsening pain in peri umbilical area.  EXAM: MRI ABDOMEN WITHOUT CONTRAST  TECHNIQUE: Multiplanar multisequence MR imaging was performed without the administration of intravenous contrast.  COMPARISON:  None  FINDINGS: Note: Exam incomplete. The patient became nauseous and vomited toward end of scan and did not want to continue further.  Lower chest:  No pleural effusion identified  Hepatobiliary: The visualized portions of the liver appear normal. The gallbladder is unremarkable. No biliary dilatation.  Pancreas: Normal appearance of the pancreas.  Spleen: The visualized portions of the spleen are normal.  Adrenals/Urinary Tract: The adrenal glands are unremarkable. Both kidneys appear normal. No obstructive uropathy. Urinary bladder appears normal.  Stomach/Bowel: The stomach is within normal limits. The small bowel loops have a normal course and caliber. No obstruction. Normal appearance of the colon. The appendix is not visualized separate from the right lower quadrant bowel loops. There are no secondary signs of acute appendicitis.  Vascular/Lymphatic: Normal appearance of the abdominal aorta. No enlarged upper abdominal lymph nodes. No pelvic or inguinal lymph nodes.  Reproductive: Gravid uterus compatible with first-trimester gestation.  Other: There is a small amount of free fluid noted within the pelvis.  Musculoskeletal: Normal signal from within the bone marrow.  IMPRESSION: 1. Exam detail diminished. The patient became nauseous and vomited toward end of scan and did not want to continue further. 2. Nonvisualization of the appendix. 3. Small amount of free fluid noted within the pelvis. 4. Gravid uterus consistent with appearance compatible with early gestation.   Electronically Signed   By: Signa Kell M.D.   On: 01/01/2015 18:58   US Ob Comp Less 14 Wks  01/01/2015   CLINICAL DATA:  Lower abdominal pain, positive pregnancy test, last normal menstrual period Nov 18, 2014.  EXAM:  OBSTETRIC <14 WK [place] AND TRANSVAGINAL OB US  TECHNIQUE: Both transabdominal and transvaginal ultrasound examinations were performed for complete evaluation of the gestation as well as the maternal uterus, adnexal regions, and pelvic cul-de-sac. Transvaginal technique was performed to assess early pregnancy.  COMPARISON:  None.  FINDINGS: Intrauterine gestational sac: Single common normal in shape  Yolk sac:  Present  Embryo:  Present  Cardiac Activity: Present  Heart Rate: 116  bpm  CRL:  3.5  mm   6 w   0 d  [place] EDC: August 27, 2015  Maternal uterus/adnexae: No subchorionic hemorrhage is observed. The maternal ovaries are unremarkable.  IMPRESSION: There is a normal appearing early IUP with estimated gestational age of [redacted] weeks 0 days with estimated date of confinement of August 27, 2015.   Electronically Signed   By: David  [place] M.D.   On: 01/01/2015 11:08   US Ob Transvaginal  01/01/2015   CLINICAL DATA:  Lower abdominal pain, positive pregnancy test, last normal menstrual period Nov 18, 2014.  EXAM: OBSTETRIC <14 WK [place] AND TRANSVAGINAL OB US  TECHNIQUE: Both transabdominal and transvaginal ultrasound examinations were performed for complete evaluation of the gestation as well as the maternal uterus, adnexal regions, and pelvic cul-de-sac. Transvaginal technique was performed to assess early pregnancy.  COMPARISON:  None.  FINDINGS: Intrauterine gestational sac: Single common normal in shape  Yolk sac:  Present  Embryo:  Present  Cardiac Activity: Present  Heart Rate: 116  bpm  CRL:  3.5  mm   6 w   0 d                  [place] EDC: August 27, 2015  Maternal uterus/adnexae: No subchorionic hemorrhage is observed. The maternal ovaries are unremarkable.  IMPRESSION: There is a normal appearing early IUP with estimated gestational age of [redacted] weeks 0 days with estimated date of confinement of August 27, 2015.   Electronically Signed   By: David  [place] M.D.   On: 01/01/2015 11:08    Mancel Bale, MD 01/01/15 2342

## 2015-01-01 NOTE — ED Notes (Signed)
Pt states she had a BM and feels much better

## 2015-01-01 NOTE — ED Notes (Signed)
Pt states she has been taking an SSRI for a week. Pt came in on Tues for vomiting. Pt found out she was pregnant that day. Pt presents today with vomiting and 9/10 abd pain. Pt shaking from the pain. Pt still on SSRI. Pt has been constipated since Sunday.

## 2015-01-02 LAB — GC/CHLAMYDIA PROBE AMP (~~LOC~~) NOT AT ARMC
CHLAMYDIA, DNA PROBE: NEGATIVE
NEISSERIA GONORRHEA: NEGATIVE

## 2015-01-03 ENCOUNTER — Observation Stay (HOSPITAL_COMMUNITY)
Admission: EM | Admit: 2015-01-03 | Discharge: 2015-01-05 | Disposition: A | Discharge status: Home / Self Care | Payer: 59 | Attending: Internal Medicine | Admitting: Internal Medicine

## 2015-01-03 ENCOUNTER — Encounter (HOSPITAL_COMMUNITY): Payer: Self-pay | Admitting: Emergency Medicine

## 2015-01-03 DIAGNOSIS — F410 Panic disorder [episodic paroxysmal anxiety] without agoraphobia: Secondary | ICD-10-CM | POA: Diagnosis not present

## 2015-01-03 DIAGNOSIS — R1110 Vomiting, unspecified: Secondary | ICD-10-CM | POA: Diagnosis present

## 2015-01-03 DIAGNOSIS — E869 Volume depletion, unspecified: Secondary | ICD-10-CM

## 2015-01-03 DIAGNOSIS — O210 Mild hyperemesis gravidarum: Secondary | ICD-10-CM | POA: Diagnosis not present

## 2015-01-03 DIAGNOSIS — F1210 Cannabis abuse, uncomplicated: Secondary | ICD-10-CM | POA: Diagnosis not present

## 2015-01-03 DIAGNOSIS — R1013 Epigastric pain: Secondary | ICD-10-CM

## 2015-01-03 DIAGNOSIS — R1112 Projectile vomiting: Secondary | ICD-10-CM

## 2015-01-03 DIAGNOSIS — F411 Generalized anxiety disorder: Secondary | ICD-10-CM

## 2015-01-03 DIAGNOSIS — Z331 Pregnant state, incidental: Secondary | ICD-10-CM

## 2015-01-03 DIAGNOSIS — O219 Vomiting of pregnancy, unspecified: Secondary | ICD-10-CM

## 2015-01-03 DIAGNOSIS — G43A1 Cyclical vomiting, intractable: Secondary | ICD-10-CM | POA: Diagnosis not present

## 2015-01-03 DIAGNOSIS — E876 Hypokalemia: Secondary | ICD-10-CM | POA: Diagnosis not present

## 2015-01-03 DIAGNOSIS — K5900 Constipation, unspecified: Secondary | ICD-10-CM | POA: Insufficient documentation

## 2015-01-03 DIAGNOSIS — Z3A01 Less than 8 weeks gestation of pregnancy: Secondary | ICD-10-CM | POA: Insufficient documentation

## 2015-01-03 LAB — COMPREHENSIVE METABOLIC PANEL WITH GFR
ALT: 20 U/L (ref 14–54)
AST: 22 U/L (ref 15–41)
Albumin: 4.2 g/dL (ref 3.5–5.0)
Alkaline Phosphatase: 50 U/L (ref 38–126)
Anion gap: 12 (ref 5–15)
BUN: <5 mg/dL — ABNORMAL LOW (ref 6–20)
CO2: 23 mmol/L (ref 22–32)
Calcium: 9.5 mg/dL (ref 8.9–10.3)
Chloride: 101 mmol/L (ref 101–111)
Creatinine, Ser: 0.76 mg/dL (ref 0.44–1.00)
GFR calc Af Amer: >60 mL/min
GFR calc non Af Amer: >60 mL/min
Glucose, Bld: 120 mg/dL — ABNORMAL HIGH (ref 65–99)
Potassium: 3.3 mmol/L — ABNORMAL LOW (ref 3.5–5.1)
Sodium: 136 mmol/L (ref 135–145)
Total Bilirubin: 0.4 mg/dL (ref 0.3–1.2)
Total Protein: 6.7 g/dL (ref 6.5–8.1)

## 2015-01-03 LAB — URINALYSIS, ROUTINE W REFLEX MICROSCOPIC
BILIRUBIN URINE: NEGATIVE
GLUCOSE, UA: NEGATIVE mg/dL
HGB URINE DIPSTICK: NEGATIVE
KETONES UR: 15 mg/dL — AB
Nitrite: NEGATIVE
PH: 6.5 (ref 5.0–8.0)
PROTEIN: NEGATIVE mg/dL
SPECIFIC GRAVITY, URINE: 1.02 (ref 1.005–1.030)
UROBILINOGEN UA: 1 mg/dL (ref 0.0–1.0)

## 2015-01-03 LAB — RAPID URINE DRUG SCREEN, HOSP PERFORMED
Amphetamines: NOT DETECTED
Barbiturates: POSITIVE — AB
Benzodiazepines: NOT DETECTED
Cocaine: NOT DETECTED
Opiates: NOT DETECTED
Tetrahydrocannabinol: POSITIVE — AB

## 2015-01-03 LAB — CBC WITH DIFFERENTIAL/PLATELET
BASOS ABS: 0 10*3/uL (ref 0.0–0.1)
BASOS PCT: 0 % (ref 0–1)
EOS ABS: 0.1 10*3/uL (ref 0.0–0.7)
Eosinophils Relative: 0 % (ref 0–5)
HEMATOCRIT: 39.1 % (ref 36.0–46.0)
Hemoglobin: 13.6 g/dL (ref 12.0–15.0)
Lymphocytes Relative: 19 % (ref 12–46)
Lymphs Abs: 3.3 10*3/uL (ref 0.7–4.0)
MCH: 31.6 pg (ref 26.0–34.0)
MCHC: 34.8 g/dL (ref 30.0–36.0)
MCV: 90.7 fL (ref 78.0–100.0)
MONO ABS: 0.9 10*3/uL (ref 0.1–1.0)
Monocytes Relative: 5 % (ref 3–12)
NEUTROS ABS: 13.1 10*3/uL — AB (ref 1.7–7.7)
Neutrophils Relative %: 76 % (ref 43–77)
Platelets: 290 10*3/uL (ref 150–400)
RBC: 4.31 MIL/uL (ref 3.87–5.11)
RDW: 12.9 % (ref 11.5–15.5)
WBC: 17.4 10*3/uL — AB (ref 4.0–10.5)

## 2015-01-03 LAB — LIPASE, BLOOD: Lipase: 27 U/L (ref 22–51)

## 2015-01-03 LAB — MAGNESIUM: MAGNESIUM: 2.2 mg/dL (ref 1.7–2.4)

## 2015-01-03 LAB — OCCULT BLOOD X 1 CARD TO LAB, STOOL
FECAL OCCULT BLD: NEGATIVE
Fecal Occult Bld: NEGATIVE

## 2015-01-03 LAB — URINE MICROSCOPIC-ADD ON

## 2015-01-03 LAB — TSH: TSH: 1.385 u[IU]/mL (ref 0.350–4.500)

## 2015-01-03 LAB — PHOSPHORUS: Phosphorus: 2.6 mg/dL (ref 2.5–4.6)

## 2015-01-03 LAB — ETHANOL: Alcohol, Ethyl (B): <5 mg/dL (ref <5)

## 2015-01-03 MED ORDER — MORPHINE SULFATE 2 MG/ML IJ SOLN
1.0000 mg | INTRAMUSCULAR | Status: DC | PRN
  Administered 2015-01-03 – 2015-01-04 (×4): 1 mg via INTRAVENOUS
  Filled 2015-01-03 (×3): qty 1

## 2015-01-03 MED ORDER — MORPHINE SULFATE 2 MG/ML IJ SOLN
INTRAMUSCULAR | Status: AC
  Filled 2015-01-03: qty 1

## 2015-01-03 MED ORDER — FENTANYL CITRATE (PF) 100 MCG/2ML IJ SOLN
50.0000 ug | Freq: Once | INTRAMUSCULAR | Status: AC
  Administered 2015-01-03: 50 ug via INTRAVENOUS
  Filled 2015-01-03: qty 2

## 2015-01-03 MED ORDER — ONDANSETRON HCL 4 MG/2ML IJ SOLN
4.0000 mg | Freq: Four times a day (QID) | INTRAMUSCULAR | Status: DC | PRN
  Administered 2015-01-03 – 2015-01-04 (×4): 4 mg via INTRAVENOUS
  Filled 2015-01-03 (×4): qty 2

## 2015-01-03 MED ORDER — ENOXAPARIN SODIUM 40 MG/0.4ML ~~LOC~~ SOLN
40.0000 mg | SUBCUTANEOUS | Status: DC
  Filled 2015-01-03: qty 0.4

## 2015-01-03 MED ORDER — PROMETHAZINE HCL 25 MG/ML IJ SOLN
12.5000 mg | Freq: Once | INTRAMUSCULAR | Status: AC
  Administered 2015-01-03: 12.5 mg via INTRAVENOUS
  Filled 2015-01-03: qty 1

## 2015-01-03 MED ORDER — POTASSIUM CHLORIDE IN NACL 40-0.9 MEQ/L-% IV SOLN
INTRAVENOUS | Status: AC
  Administered 2015-01-03 – 2015-01-04 (×3): 125 mL/h via INTRAVENOUS
  Filled 2015-01-03 (×4): qty 1000

## 2015-01-03 MED ORDER — METOCLOPRAMIDE HCL 5 MG/ML IJ SOLN
10.0000 mg | Freq: Once | INTRAMUSCULAR | Status: AC
  Administered 2015-01-03: 10 mg via INTRAVENOUS
  Filled 2015-01-03: qty 2

## 2015-01-03 MED ORDER — SODIUM CHLORIDE 0.9 % IV BOLUS (SEPSIS)
1000.0000 mL | Freq: Once | INTRAVENOUS | Status: AC
  Administered 2015-01-03: 1000 mL via INTRAVENOUS

## 2015-01-03 MED ORDER — SENNA 8.6 MG PO TABS: 2.0000 | ORAL_TABLET | Freq: Every day | ORAL | Status: DC

## 2015-01-03 MED ORDER — SUCRALFATE 1 GM/10ML PO SUSP
1.0000 g | Freq: Three times a day (TID) | ORAL | Status: DC
  Administered 2015-01-03: 1 g via ORAL
  Filled 2015-01-03 (×5): qty 10

## 2015-01-03 MED ORDER — ONDANSETRON HCL 4 MG/2ML IJ SOLN
4.0000 mg | Freq: Once | INTRAMUSCULAR | Status: AC
  Administered 2015-01-03: 4 mg via INTRAVENOUS
  Filled 2015-01-03: qty 2

## 2015-01-03 MED ORDER — PROSIGHT PO TABS
1.0000 | ORAL_TABLET | Freq: Every day | ORAL | Status: DC
  Administered 2015-01-04 – 2015-01-05 (×2): 1 via ORAL
  Filled 2015-01-03 (×3): qty 1

## 2015-01-03 MED ORDER — MAGNESIUM CITRATE PO SOLN
1.0000 | Freq: Once | ORAL | Status: AC
  Administered 2015-01-03: 1 via ORAL
  Filled 2015-01-03: qty 296

## 2015-01-03 MED ORDER — FAMOTIDINE IN NACL 20-0.9 MG/50ML-% IV SOLN
20.0000 mg | Freq: Two times a day (BID) | INTRAVENOUS | Status: DC
  Administered 2015-01-03 – 2015-01-05 (×5): 20 mg via INTRAVENOUS
  Filled 2015-01-03 (×6): qty 50

## 2015-01-03 MED ORDER — ONDANSETRON HCL 4 MG PO TABS: 4.0000 mg | ORAL_TABLET | Freq: Four times a day (QID) | ORAL | Status: DC | PRN

## 2015-01-03 MED ORDER — LORAZEPAM 2 MG/ML IJ SOLN
0.5000 mg | Freq: Once | INTRAMUSCULAR | Status: AC
  Administered 2015-01-03: 0.5 mg via INTRAVENOUS
  Filled 2015-01-03: qty 1

## 2015-01-03 NOTE — ED Notes (Signed)
Attempted report 

## 2015-01-03 NOTE — ED Notes (Signed)
Admitting team at bedside.

## 2015-01-03 NOTE — ED Notes (Signed)
Pt reports upper abd pain with n/v intermittent since Tuesday; pt reports no BM since Sunday or Monday; pt reports being seen here recently for the same; no relief with mineral oil, miralax, phenergan, zofran,

## 2015-01-03 NOTE — ED Provider Notes (Signed)
CSN: [id number]     Arrival date & time 01/03/15  0520 History   First MD Initiated Contact with Patient 01/03/15 [phone]     Chief Complaint  Patient presents with  . Abdominal Pain  . Emesis  . Constipation     (Consider location/radiation/quality/duration/timing/severity/associated sxs/prior Treatment) HPI Laura Frank is a 24 y.o. female with anxiety, presents to ED with complaint of abdominal pain, nausea, vomiting. Pt states symptoms started 9 days ago. States has been seen here twice for the same. States now having tinges of blood in emesis. Reports has had [place] and MR abdomen, which was nondiagnostic. Pt denies fever or chills. No urinary symptoms. No back pain. No pelvic pain. No vaginal discharge or bleeding. States tried miralax, stool softner, mineral oil and had enema here with no results. Also has been taking phenergan, zofran with no improvement in her nausea. Pt is [redacted]wks pregnant. States has an appointment on 12th to terminate pregnancy. Pt denies similar symptoms in the past.    Past Medical History  Diagnosis Date  . Generalized anxiety disorder    History reviewed. No pertinent past surgical history. History reviewed. No pertinent family history. History  Substance Use Topics  . Smoking status: Never Smoker   . Smokeless tobacco: Not on file  . Alcohol Use: No   OB History    No data available     Review of Systems  Constitutional: Positive for fatigue. Negative for fever and chills.  Respiratory: Negative for cough, chest tightness and shortness of breath.   Cardiovascular: Negative for chest pain, palpitations and leg swelling.  Gastrointestinal: Positive for nausea, vomiting and abdominal pain. Negative for diarrhea.  Genitourinary: Negative for dysuria, flank pain, vaginal bleeding, vaginal discharge, vaginal pain and pelvic pain.  Musculoskeletal: Negative for myalgias, arthralgias, neck pain and neck stiffness.  Skin: Negative for rash.  Neurological:  Positive for weakness. Negative for dizziness and headaches.  All other systems reviewed and are negative.     Allergies  Review of patient's allergies indicates no known allergies.  Home Medications   Prior to Admission medications   Medication Sig Start Date End Date Taking? Authorizing Provider  dimenhyDRINATE (DRAMAMINE) 50 MG tablet Take 50 mg by mouth every 8 (eight) hours as needed for nausea.    Historical Provider, MD  escitalopram (LEXAPRO) 10 MG tablet Take 10 mg by mouth at bedtime. 12/20/14   Historical Provider, MD  HYDROcodone-acetaminophen (NORCO) 5-325 MG per tablet Take 1 tablet by mouth every 4 (four) hours as needed. 01/01/15   Mancel Bale, MD  promethazine (PHENERGAN) 25 MG tablet Take 1 tablet (25 mg total) by mouth every 6 (six) hours as needed for nausea or vomiting. 12/30/14   Pricilla Loveless, MD  RECLIPSEN 0.15-30 MG-MCG tablet Take 1 tablet by mouth daily. 11/05/14   Historical Provider, MD   BP 130/59 mmHg  Pulse 79  Temp(Src) 98.4 F (36.9 C) (Oral)  Resp 24  SpO2 100%  LMP 11/18/2014 Physical Exam  Constitutional: She is oriented to person, place, and time. She appears well-developed and well-nourished.  Tearful, shaking  HENT:  Head: Normocephalic.  Eyes: Conjunctivae are normal.  Neck: Neck supple.  Cardiovascular: Normal rate, regular rhythm and normal heart sounds.   Pulmonary/Chest: Effort normal and breath sounds normal. No respiratory distress. She has no wheezes. She has no rales.  Abdominal: Soft. Bowel sounds are normal. She exhibits no distension. There is tenderness. There is no rebound and no guarding.  LUQ, RUQ, epigastric tenderness.  Actively vomiting  Musculoskeletal: She exhibits no edema.  Neurological: She is alert and oriented to person, place, and time.  Skin: Skin is warm and dry.  Psychiatric:  Pt appears very anxious, tearful, shaking  Nursing note and vitals reviewed.   ED Course  Procedures (including critical care  time) Labs Review Labs Reviewed  CBC WITH DIFFERENTIAL/PLATELET - Abnormal; Notable for the following:    WBC 17.4 (*)    Neutro Abs 13.1 (*)    All other components within normal limits  COMPREHENSIVE METABOLIC PANEL - Abnormal; Notable for the following:    Potassium 3.3 (*)    Glucose, Bld 120 (*)    BUN <5 (*)    All other components within normal limits  LIPASE, BLOOD  URINALYSIS, ROUTINE W REFLEX MICROSCOPIC (NOT AT [facility])  URINE RAPID DRUG SCREEN, HOSP PERFORMED  POC URINE PREG, ED    Imaging Review Mr Abdomen Wo Contrast  01/01/2015   CLINICAL DATA:  Evaluate for appendicitis. Early gestation pregnancy. Presents today with continued/worsening pain in peri umbilical area.  EXAM: MRI ABDOMEN WITHOUT CONTRAST  TECHNIQUE: Multiplanar multisequence MR imaging was performed without the administration of intravenous contrast.  COMPARISON:  None  FINDINGS: Note: Exam incomplete. The patient became nauseous and vomited toward end of scan and did not want to continue further.  Lower chest:  No pleural effusion identified  Hepatobiliary: The visualized portions of the liver appear normal. The gallbladder is unremarkable. No biliary dilatation.  Pancreas: Normal appearance of the pancreas.  Spleen: The visualized portions of the spleen are normal.  Adrenals/Urinary Tract: The adrenal glands are unremarkable. Both kidneys appear normal. No obstructive uropathy. Urinary bladder appears normal.  Stomach/Bowel: The stomach is within normal limits. The small bowel loops have a normal course and caliber. No obstruction. Normal appearance of the colon. The appendix is not visualized separate from the right lower quadrant bowel loops. There are no secondary signs of acute appendicitis.  Vascular/Lymphatic: Normal appearance of the abdominal aorta. No enlarged upper abdominal lymph nodes. No pelvic or inguinal lymph nodes.  Reproductive: Gravid uterus compatible with first-trimester gestation.  Other: There is  a small amount of free fluid noted within the pelvis.  Musculoskeletal: Normal signal from within the bone marrow.  IMPRESSION: 1. Exam detail diminished. The patient became nauseous and vomited toward end of scan and did not want to continue further. 2. Nonvisualization of the appendix. 3. Small amount of free fluid noted within the pelvis. 4. Gravid uterus consistent with appearance compatible with early gestation.   Electronically Signed   By: Signa Kell M.D.   On: 01/01/2015 18:58   US Ob Comp Less 14 Wks  01/01/2015   CLINICAL DATA:  Lower abdominal pain, positive pregnancy test, last normal menstrual period Nov 18, 2014.  EXAM: OBSTETRIC <14 WK [place] AND TRANSVAGINAL OB US  TECHNIQUE: Both transabdominal and transvaginal ultrasound examinations were performed for complete evaluation of the gestation as well as the maternal uterus, adnexal regions, and pelvic cul-de-sac. Transvaginal technique was performed to assess early pregnancy.  COMPARISON:  None.  FINDINGS: Intrauterine gestational sac: Single common normal in shape  Yolk sac:  Present  Embryo:  Present  Cardiac Activity: Present  Heart Rate: 116  bpm  CRL:  3.5  mm   6 w   0 d                  [place] EDC: August 27, 2015  Maternal uterus/adnexae: No subchorionic hemorrhage is  observed. The maternal ovaries are unremarkable.  IMPRESSION: There is a normal appearing early IUP with estimated gestational age of [redacted] weeks 0 days with estimated date of confinement of August 27, 2015.   Electronically Signed   By: David  SwazilandJordan M.D.   On: 01/01/2015 11:08   [place] Ob Transvaginal  01/01/2015   CLINICAL DATA:  Lower abdominal pain, positive pregnancy test, last normal menstrual period Nov 18, 2014.  EXAM: OBSTETRIC <14 WK US AND TRANSVAGINAL OB US  TECHNIQUE: Both transabdominal and transvaginal ultrasound examinations were performed for complete evaluation of the gestation as well as the maternal uterus, adnexal regions, and pelvic cul-de-sac. Transvaginal  technique was performed to assess early pregnancy.  COMPARISON:  None.  FINDINGS: Intrauterine gestational sac: Single common normal in shape  Yolk sac:  Present  Embryo:  Present  Cardiac Activity: Present  Heart Rate: 116  bpm  CRL:  3.5  mm   6 w   0 d                  US EDC: August 27, 2015  Maternal uterus/adnexae: No subchorionic hemorrhage is observed. The maternal ovaries are unremarkable.  IMPRESSION: There is a normal appearing early IUP with estimated gestational age of [redacted] weeks 0 days with estimated date of confinement of August 27, 2015.   Electronically Signed   By: David  [place]andJordan M.D.   On: 01/01/2015 11:08     EKG Interpretation None      MDM   Final diagnoses:  Nausea and vomiting during pregnancy prior to [redacted] weeks gestation  Epigastric pain   Pt in ED with nausea, vomiting, abdominal pain. Pain mainly epigastric. Pt's 3rd visit in ED for the same. US pelvis and MR abdomen with no acute findings. Pt terminated MRI early due to pain. PT also thinks she is constipated. Will try fluids, reglan, ativan, fentanyl.   8:17 AM Pt initially felt better after medications, but started vomiting again. Labs unremarkable except for leukocytosis, and potassium of 3.3. Drug screen positive for marijuana and barbituates. I discussed with patient her use of marijuana, and instructed to stop since it could be a potential cause of her symptoms. I did emphasize to her that it is not definite but a potential cause, given she is a daily user. Patient later told the nurse that she did not like what I had to say. I've also discussed with her possibility of this being gastritis, acid reflux, possibly peptic ulcer disease. I have discussed with them how it is treated. At present patient is actively vomiting. Ordered Phenergan and some more Zofran. She is requesting Dilaudid, fentanyl ordered. Will get admitted for rehydration and management of her symptoms.  8:20 AM Spoke with teaching service. Will  admit  Filed Vitals:   01/03/15 1045 01/03/15 1134 01/03/15 1154 01/03/15 1155  BP: 103/57 118/71 134/76 136/81  Pulse: 96 89 89 87  Temp:  98 F (36.7 C)    TempSrc:  Oral    Resp:      SpO2: 100% 100% 100% 100%     Jaynie Crumbleatyana Victorio Creeden, PA-C 01/03/15 1549  April Palumbo, MD 01/03/15 2349

## 2015-01-03 NOTE — H&P (Signed)
Date: 01/03/2015               Patient Name:  Laura Frank MRN: [id number]  DOB: 02/22/1991 Age / Sex: 24 y.o., female   PCP: No Pcp Per Patient         Medical Service: Internal Medicine Teaching Service         Attending Physician: Dr. Doneen Poisson, MD    First Contact: Dr. Dimple Casey Pager: [phone]  Second Contact: Dr. Mariea Clonts Pager: [phone]       After Hours (After 5p/  First Contact Pager: [phone]  weekends / holidays): Second Contact Pager: [phone]   Chief Complaint: Nausea, vomiting, abdominal pain  History of Present Illness: [name] is a 24 y/o white female with generalized anxiety disorder and is Primigravida at [redacted] weeks gestation presenting to the ED 7/2 with nausea, vomiting, and abdominal pain since 4:30am this morning. She was previously seen at this ED on 6/28 and 6/30 for similar symptoms. At those admissions her symptoms resolved with phenergan and dilaudid, but returned at home. Emesis is mostly clear and yellow liquid with scant red/brown streaking after protracted vomiting. She has reduced solid PO intake at home. Her pain is continuous around epigastric location with occasional radiation upward to her chest. She has also been constipated without bowel movement since Sunday 6/26. She has used mineral oil, miralax, and high fiber diet without improvement. She has undergone ultrasound and MRI of abdomen which was nondiagnostic but was limited by patient vomiting. She denies any cough, recent illness, SOB, vaginal discharge, or dysuria. She is a daily marijuana user for 6-7 years with no recent change. She takes reclipsen for chemical contraception and reports restarting citalopram 8 days ago for GAD after 1 month discontinued. She also states this episode is similar to ones previously experienced in July 2015 and March 2016, but that did not require hospital management.   Meds: Current Facility-Administered Medications  Medication Dose Route Frequency Provider  Last Rate Last Dose  . promethazine (PHENERGAN) injection 12.5 mg  12.5 mg Intravenous Once Tatyana Kirichenko, PA-C      . sucralfate (CARAFATE) 1 GM/10ML suspension 1 g  1 g Oral TID WC & HS Tatyana Kirichenko, PA-C   1 g at 01/03/15 5784   Current Outpatient Prescriptions  Medication Sig Dispense Refill  . dimenhyDRINATE (DRAMAMINE) 50 MG tablet Take 50 mg by mouth every 8 (eight) hours as needed for nausea.    Marland Kitchen docusate sodium (COLACE) 100 MG capsule Take 100 mg by mouth daily as needed for mild constipation.    Marland Kitchen escitalopram (LEXAPRO) 10 MG tablet Take 10 mg by mouth at bedtime.  1  . ibuprofen (ADVIL,MOTRIN) 200 MG tablet Take 200 mg by mouth every 6 (six) hours as needed for mild pain, moderate pain or cramping.    . polyethylene glycol (MIRALAX / GLYCOLAX) packet Take 17 g by mouth daily as needed for mild constipation or moderate constipation.    . promethazine (PHENERGAN) 25 MG tablet Take 1 tablet (25 mg total) by mouth every 6 (six) hours as needed for nausea or vomiting. 10 tablet 0  . RECLIPSEN 0.15-30 MG-MCG tablet Take 1 tablet by mouth daily.  3  . HYDROcodone-acetaminophen (NORCO) 5-325 MG per tablet Take 1 tablet by mouth every 4 (four) hours as needed. 20 tablet 0    Allergies: Allergies as of 01/03/2015  . (No Known Allergies)   Past Medical History  Diagnosis Date  . Generalized anxiety disorder  History reviewed. No pertinent past surgical history. History reviewed. No pertinent family history. History   Social History  . Marital Status: Single    Spouse Name: N/A  . Number of Children: N/A  . Years of Education: N/A   Occupational History  . Not on file.   Social History Main Topics  . Smoking status: Never Smoker   . Smokeless tobacco: Not on file  . Alcohol Use: No  . Drug Use: Yes    Special: Marijuana  . Sexual Activity: Yes    Birth Control/ Protection: Pill   Other Topics Concern  . Not on file   Social History Narrative    Review  of Systems: Review of Systems  Gastrointestinal: Positive for nausea, vomiting, abdominal pain and constipation.  Neurological: Positive for weakness and headaches.  Psychiatric/Behavioral: The patient is nervous/anxious.   All other systems reviewed and are negative.    Physical Exam: Blood pressure 99/49, pulse 77, temperature 98.4 F (36.9 C), temperature source Oral, resp. rate 24, last menstrual period 11/18/2014, SpO2 99 %.  GENERAL- alert, co-operative, appears as stated age, not in any distress. HEENT- PERRL, EOMI, oral mucosa mildly dry, good and intact dentition. No cervical lymphadenopathy, neck supple. CARDIAC- Regular rhythm, rate >100bpm, no murmurs, rubs or gallops. RESP- Moving equal volumes of air, and clear to auscultation bilaterally, no wheezes or crackles. ABDOMEN- Soft, tender predominantly over RUQ and epigastric, bowel gas noted in b/l lower quadrants, no rebound tenderness, bowel sounds present in all 4 quadrants BACK- Normal curvature of the spine, No tenderness along the vertebrae, no CVA tenderness. NEURO- No obvious Cr N abnormality EXTREMITIES- pulse 2+, symmetric, no pedal edema. SKIN- Warm, dry, No rash or lesion. PSYCH- Emotional lability and anxiety, appropriate thought content and speech.  Lab results: Lab Results  Component Value Date/Time   WBC 17.4* 01/03/2015 05:39 AM   K 3.3* 01/03/2015 05:39 AM   Basic Metabolic Panel:  Recent Labs  78/29/56 0827 01/03/15 0539  NA 136 136  K 3.3* 3.3*  CL 104 101  CO2 20* 23  GLUCOSE 112* 120*  BUN 6 <5*  CREATININE 0.71 0.76  CALCIUM 9.9 9.5   Liver Function Tests:  Recent Labs  01/01/15 0827 01/03/15 0539  AST 30 22  ALT 25 20  ALKPHOS 53 50  BILITOT 0.9 0.4  PROT 7.0 6.7  ALBUMIN 4.4 4.2    Recent Labs  01/01/15 0827 01/03/15 0539  LIPASE 23 27   CBC:  Recent Labs  01/01/15 0827 01/03/15 0539  WBC 16.7* 17.4*  NEUTROABS 13.5* 13.1*  HGB 12.9 13.6  HCT 36.2 39.1  MCV  88.3 90.7  PLT 284 290   Urine Drug Screen: Drugs of Abuse     Component Value Date/Time   LABOPIA NONE DETECTED 01/03/2015 0630   COCAINSCRNUR NONE DETECTED 01/03/2015 0630   LABBENZ NONE DETECTED 01/03/2015 0630   AMPHETMU NONE DETECTED 01/03/2015 0630   THCU POSITIVE* 01/03/2015 0630   LABBARB POSITIVE* 01/03/2015 0630    Alcohol Level: No results for input(s): ETH in the last 72 hours. Urinalysis:  Recent Labs  01/01/15 1114 01/03/15 0630  COLORURINE YELLOW YELLOW  LABSPEC 1.009 1.020  PHURINE 7.0 6.5  GLUCOSEU NEGATIVE NEGATIVE  HGBUR NEGATIVE NEGATIVE  BILIRUBINUR NEGATIVE NEGATIVE  KETONESUR 40* 15*  PROTEINUR NEGATIVE NEGATIVE  UROBILINOGEN 0.2 1.0  NITRITE NEGATIVE NEGATIVE  LEUKOCYTESUR NEGATIVE TRACE*    baImaging results:  Mr Abdomen Wo Contrast  01/01/2015   CLINICAL DATA:  Evaluate  for appendicitis. Early gestation pregnancy. Presents today with continued/worsening pain in peri umbilical area.  EXAM: MRI ABDOMEN WITHOUT CONTRAST  TECHNIQUE: Multiplanar multisequence MR imaging was performed without the administration of intravenous contrast.  COMPARISON:  None  FINDINGS: Note: Exam incomplete. The patient became nauseous and vomited toward end of scan and did not want to continue further.  Lower chest:  No pleural effusion identified  Hepatobiliary: The visualized portions of the liver appear normal. The gallbladder is unremarkable. No biliary dilatation.  Pancreas: Normal appearance of the pancreas.  Spleen: The visualized portions of the spleen are normal.  Adrenals/Urinary Tract: The adrenal glands are unremarkable. Both kidneys appear normal. No obstructive uropathy. Urinary bladder appears normal.  Stomach/Bowel: The stomach is within normal limits. The small bowel loops have a normal course and caliber. No obstruction. Normal appearance of the colon. The appendix is not visualized separate from the right lower quadrant bowel loops. There are no secondary signs  of acute appendicitis.  Vascular/Lymphatic: Normal appearance of the abdominal aorta. No enlarged upper abdominal lymph nodes. No pelvic or inguinal lymph nodes.  Reproductive: Gravid uterus compatible with first-trimester gestation.  Other: There is a small amount of free fluid noted within the pelvis.  Musculoskeletal: Normal signal from within the bone marrow.  IMPRESSION: 1. Exam detail diminished. The patient became nauseous and vomited toward end of scan and did not want to continue further. 2. Nonvisualization of the appendix. 3. Small amount of free fluid noted within the pelvis. 4. Gravid uterus consistent with appearance compatible with early gestation.   Electronically Signed   By: Signa Kell M.D.   On: 01/01/2015 18:58   US Ob Comp Less 14 Wks  01/01/2015   CLINICAL DATA:  Lower abdominal pain, positive pregnancy test, last normal menstrual period Nov 18, 2014.  EXAM: OBSTETRIC <14 WK [place] AND TRANSVAGINAL OB US  TECHNIQUE: Both transabdominal and transvaginal ultrasound examinations were performed for complete evaluation of the gestation as well as the maternal uterus, adnexal regions, and pelvic cul-de-sac. Transvaginal technique was performed to assess early pregnancy.  COMPARISON:  None.  FINDINGS: Intrauterine gestational sac: Single common normal in shape  Yolk sac:  Present  Embryo:  Present  Cardiac Activity: Present  Heart Rate: 116  bpm  CRL:  3.5  mm   6 w   0 d                  [place] EDC: August 27, 2015  Maternal uterus/adnexae: No subchorionic hemorrhage is observed. The maternal ovaries are unremarkable.  IMPRESSION: There is a normal appearing early IUP with estimated gestational age of [redacted] weeks 0 days with estimated date of confinement of August 27, 2015.   Electronically Signed   By: David  [place] M.D.   On: 01/01/2015 11:08   US Ob Transvaginal  01/01/2015   CLINICAL DATA:  Lower abdominal pain, positive pregnancy test, last normal menstrual period Nov 18, 2014.  EXAM:  OBSTETRIC <14 WK [place] AND TRANSVAGINAL OB US  TECHNIQUE: Both transabdominal and transvaginal ultrasound examinations were performed for complete evaluation of the gestation as well as the maternal uterus, adnexal regions, and pelvic cul-de-sac. Transvaginal technique was performed to assess early pregnancy.  COMPARISON:  None.  FINDINGS: Intrauterine gestational sac: Single common normal in shape  Yolk sac:  Present  Embryo:  Present  Cardiac Activity: Present  Heart Rate: 116  bpm  CRL:  3.5  mm   6 w   0 d  US EDC: August 27, 2015  Maternal uterus/adnexae: No subchorionic hemorrhage is observed. The maternal ovaries are unremarkable.  IMPRESSION: There is a normal appearing early IUP with estimated gestational age of [redacted] weeks 0 days with estimated date of confinement of August 27, 2015.   Electronically Signed   By: David  [place] M.D.   On: 01/01/2015 11:08    Assessment & Plan by Problem: Active Problems:   Intractable vomiting Intractable vomiting with volume depletion: Prolonged vomiting recurrent during this week with reduced PO intake. Etiology likely hyperemesis gravidarum possibly exacerbated with chronic marijuana use. Gastritis, peptic/duodenal ulcer, or medication side effect from restarting SSRI are also possibilities at this time. Laboratory studies and diagnostic imaging to date suggest against cholecystitis, appendicitis, ectopic pregnancy, or pancreatitis as underlying causes.  Ondansentron 4mg  IV x2 and metaclopramide 10mg  IV given at ED. - Admit to medicine service - Ondansetron 4mg  IV PRN - IV NS 14925mL/hr w/40 mEq KCL - morphine 1mg  IV q4hrs - famotidine 20mg  IV q12hrs - Follow I/Os - Magnesium, Phosphorus, TSH now - Repeat CBC and BMP in AM  Constipation: last BM 6 days prior to admission, Hx of dark stools preceding. Mag citrate given in ED course without spontaneous BM to this time. - FOBT for r/o melena - Senna 17.2g  Pregnancy: [redacted] weeks gestational age,  patient [facility] - Avoid toxic agents in pregnancy as possible  Leukocytosis: Likely related to hyperemesis, elevated at 17.4 which is not baseline - Follow repeat CBC  Substance Use: Daily marijuana use for 6-7 years, discussed with patient and her mother at bedside. Denies EtOH or other drug use. - Ethanol level - Discuss possible association  Generalized Anxiety Disorder: Reports restarting Lexapro 8 days ago after 1 month of discontinuing - Hold during current admission, SSRI may be associated with nausea/vomiting when started in some patients  FEN clear liquid diet, multivitamin FULL CODE VTE ppx Lovenox 40mg  qdaily  Dispo: Disposition is deferred at this time, awaiting improvement of current medical problems. Anticipated discharge in approximately 1-2 day(s).   The patient does have a current PCP Marlana Latus(Lorie Seymour) and does not know need an [facility] hospital follow-up appointment after discharge.  The patient does not have transportation limitations that hinder transportation to clinic appointments.  Signed: Fuller Planhristopher W Bobbye Petti, MD 01/03/2015, 10:22 AM

## 2015-01-03 NOTE — Progress Notes (Signed)
Pt into 6N05, oriented to room.  Pt c/o severe pain mid to upper abd.

## 2015-01-04 ENCOUNTER — Encounter (HOSPITAL_COMMUNITY): Payer: Self-pay | Admitting: *Deleted

## 2015-01-04 DIAGNOSIS — F410 Panic disorder [episodic paroxysmal anxiety] without agoraphobia: Secondary | ICD-10-CM | POA: Diagnosis not present

## 2015-01-04 DIAGNOSIS — Z3A01 Less than 8 weeks gestation of pregnancy: Secondary | ICD-10-CM | POA: Diagnosis not present

## 2015-01-04 DIAGNOSIS — O210 Mild hyperemesis gravidarum: Secondary | ICD-10-CM | POA: Diagnosis not present

## 2015-01-04 DIAGNOSIS — E876 Hypokalemia: Secondary | ICD-10-CM | POA: Diagnosis not present

## 2015-01-04 DIAGNOSIS — E869 Volume depletion, unspecified: Secondary | ICD-10-CM | POA: Diagnosis not present

## 2015-01-04 DIAGNOSIS — F1210 Cannabis abuse, uncomplicated: Secondary | ICD-10-CM | POA: Diagnosis not present

## 2015-01-04 DIAGNOSIS — R1112 Projectile vomiting: Secondary | ICD-10-CM | POA: Diagnosis not present

## 2015-01-04 DIAGNOSIS — F411 Generalized anxiety disorder: Secondary | ICD-10-CM | POA: Diagnosis not present

## 2015-01-04 LAB — BASIC METABOLIC PANEL
Anion gap: 7 (ref 5–15)
BUN: <5 mg/dL — ABNORMAL LOW (ref 6–20)
CALCIUM: 9.2 mg/dL (ref 8.9–10.3)
CHLORIDE: 106 mmol/L (ref 101–111)
CO2: 22 mmol/L (ref 22–32)
CREATININE: 0.6 mg/dL (ref 0.44–1.00)
GFR calc non Af Amer: >60 mL/min (ref >60)
GLUCOSE: 110 mg/dL — AB (ref 65–99)
POTASSIUM: 3.9 mmol/L (ref 3.5–5.1)
SODIUM: 135 mmol/L (ref 135–145)

## 2015-01-04 LAB — OCCULT BLOOD X 1 CARD TO LAB, STOOL: Fecal Occult Bld: NEGATIVE

## 2015-01-04 LAB — TROPONIN I: Troponin I: <0.03 ng/mL (ref <0.031)

## 2015-01-04 LAB — CBC
HCT: 36.7 % (ref 36.0–46.0)
HEMOGLOBIN: 12.6 g/dL (ref 12.0–15.0)
MCH: 30.7 pg (ref 26.0–34.0)
MCHC: 34.3 g/dL (ref 30.0–36.0)
MCV: 89.3 fL (ref 78.0–100.0)
Platelets: 257 10*3/uL (ref 150–400)
RBC: 4.11 MIL/uL (ref 3.87–5.11)
RDW: 13 % (ref 11.5–15.5)
WBC: 12.3 10*3/uL — ABNORMAL HIGH (ref 4.0–10.5)

## 2015-01-04 MED ORDER — ONDANSETRON HCL 4 MG/2ML IJ SOLN: 4.0000 mg | Freq: Four times a day (QID) | INTRAMUSCULAR | Status: DC | PRN

## 2015-01-04 MED ORDER — ONDANSETRON 4 MG PO TBDP: 4.0000 mg | ORAL_TABLET | Freq: Three times a day (TID) | ORAL | Status: DC | PRN

## 2015-01-04 MED ORDER — LORAZEPAM 2 MG/ML IJ SOLN
0.5000 mg | Freq: Once | INTRAMUSCULAR | Status: AC
  Administered 2015-01-04: 0.5 mg via INTRAVENOUS
  Filled 2015-01-04: qty 1

## 2015-01-04 MED ORDER — LORAZEPAM 2 MG/ML IJ SOLN
0.5000 mg | Freq: Four times a day (QID) | INTRAMUSCULAR | Status: DC | PRN
Start: 2015-01-04 — End: 2015-01-05
  Administered 2015-01-04 – 2015-01-05 (×3): 0.5 mg via INTRAVENOUS
  Filled 2015-01-04 (×3): qty 1

## 2015-01-04 MED ORDER — LORAZEPAM 1 MG PO TABS: 1.0000 mg | ORAL_TABLET | Freq: Two times a day (BID) | ORAL | Status: DC | PRN

## 2015-01-04 MED ORDER — ONDANSETRON HCL 4 MG PO TABS
4.0000 mg | ORAL_TABLET | ORAL | Status: DC | PRN
  Administered 2015-01-04 – 2015-01-05 (×2): 4 mg via ORAL
  Filled 2015-01-04 (×2): qty 1

## 2015-01-04 MED ORDER — PROMETHAZINE HCL 25 MG/ML IJ SOLN
12.5000 mg | INTRAMUSCULAR | Status: DC | PRN
  Administered 2015-01-05: 12.5 mg via INTRAVENOUS
  Filled 2015-01-04 (×2): qty 1

## 2015-01-04 MED ORDER — FAMOTIDINE 20 MG PO TABS: 20.0000 mg | ORAL_TABLET | Freq: Two times a day (BID) | ORAL | Status: DC

## 2015-01-04 NOTE — Progress Notes (Signed)
Pt tried to get up and wash up a little but started dry heaving and laid back down.  Pt is pale, has really not been able to keep anything down today, just a few sips of things.  Mother at bedside.  Dr. Truitt MerleNotified and to see if could try the zofran disintegrating tablet.

## 2015-01-04 NOTE — Progress Notes (Signed)
Subjective: Episode of nausea and chest pain o/n around 430am. Pain improved with sitting upright and resolved after 1mg  ativan given. Nausea is improved this morning, and tolerating increased clear liquids in diet.  Objective: Vital signs in last 24 hours: Filed Vitals:   01/03/15 1155 01/03/15 1610 01/03/15 2114 01/04/15 0421  BP: 136/81 116/75 101/76 118/78  Pulse: 87 88 72 59  Temp:  98.2 F (36.8 C) 98.3 F (36.8 C) 98.4 F (36.9 C)  TempSrc:  Oral Oral Oral  Resp:   17   SpO2: 100% 100% 100% 100%    Intake/Output Summary (Last 24 hours) at 01/04/15 1114 Last data filed at 01/04/15 0700  Gross per 24 hour  Intake 2998.75 ml  Output   1800 ml  Net 1198.75 ml   GENERAL- alert, co-operative, appears as stated age, not in any distress. HEENT- PERRL, EOMI, oral mucosa moist, good and intact dentition. No cervical lymphadenopathy, neck supple. CARDIAC- RRR, no murmurs, rubs or gallops. RESP- Moving equal volumes of air, and clear to auscultation bilaterally, no wheezes or crackles. ABDOMEN- Soft, no tenderness on palpation, no rebound tenderness, bowel sounds normoactive in all 4 quadrants NEURO- No obvious Cr N abnormality EXTREMITIES- pulse 2+, symmetric, no pedal edema. SKIN- Warm, dry, No rash or lesion. PSYCH- Appropriate mood and affect, appropriate thought content and speech.  Lab Results: Basic Metabolic Panel:  Recent Labs Lab 01/03/15 0539 01/03/15 1450 01/04/15 0420  NA 136  --  135  K 3.3*  --  3.9  CL 101  --  106  CO2 23  --  22  GLUCOSE 120*  --  110*  BUN <5*  --  <5*  CREATININE 0.76  --  0.60  CALCIUM 9.5  --  9.2  MG  --  2.2  --   PHOS  --  2.6  --    Liver Function Tests:  Recent Labs Lab 01/01/15 0827 01/03/15 0539  AST 30 22  ALT 25 20  ALKPHOS 53 50  BILITOT 0.9 0.4  PROT 7.0 6.7  ALBUMIN 4.4 4.2    Recent Labs Lab 01/01/15 0827 01/03/15 0539  LIPASE 23 27   CBC:  Recent Labs Lab 01/01/15 0827 01/03/15 0539  01/04/15 0420  WBC 16.7* 17.4* 12.3*  NEUTROABS 13.5* 13.1*  --   HGB 12.9 13.6 12.6  HCT 36.2 39.1 36.7  MCV 88.3 90.7 89.3  PLT 284 290 257   Cardiac Enzymes:  Recent Labs Lab 01/04/15 0420  TROPONINI <0.03   Thyroid Function Tests:  Recent Labs Lab 01/03/15 1450  TSH 1.385   Urine Drug Screen: Drugs of Abuse     Component Value Date/Time   LABOPIA NONE DETECTED 01/03/2015 0630   COCAINSCRNUR NONE DETECTED 01/03/2015 0630   LABBENZ NONE DETECTED 01/03/2015 0630   AMPHETMU NONE DETECTED 01/03/2015 0630   THCU POSITIVE* 01/03/2015 0630   LABBARB POSITIVE* 01/03/2015 0630    Alcohol Level:  Recent Labs Lab 01/03/15 1450  ETH <5   Urinalysis:  Recent Labs Lab 01/01/15 1114 01/03/15 0630  COLORURINE YELLOW YELLOW  LABSPEC 1.009 1.020  PHURINE 7.0 6.5  GLUCOSEU NEGATIVE NEGATIVE  HGBUR NEGATIVE NEGATIVE  BILIRUBINUR NEGATIVE NEGATIVE  KETONESUR 40* 15*  PROTEINUR NEGATIVE NEGATIVE  UROBILINOGEN 0.2 1.0  NITRITE NEGATIVE NEGATIVE  LEUKOCYTESUR NEGATIVE TRACE*   Micro Results: Recent Results (from the past 240 hour(s))  Wet prep, genital     Status: Abnormal   Collection Time: 01/01/15  9:35 AM  Result  Value Ref Range Status   Yeast Wet Prep HPF POC NONE SEEN NONE SEEN Final   Trich, Wet Prep NONE SEEN NONE SEEN Final   Clue Cells Wet Prep HPF POC TOO NUMEROUS TO COUNT (A) NONE SEEN Final   WBC, Wet Prep HPF POC TOO NUMEROUS TO COUNT (A) NONE SEEN Final   Medications: I have reviewed the patient's current medications. Scheduled Meds: . famotidine (PEPCID) IV  20 mg Intravenous Q12H  . multivitamin  1 tablet Oral Daily   Continuous Infusions: . 0.9 % NaCl with KCl 40 mEq / L 125 mL/hr (01/04/15 0832)   PRN Meds:.LORazepam, ondansetron **OR** ondansetron (ZOFRAN) IV Assessment/Plan: Active Problems:   Intractable vomiting Assessment & Plan by Problem: Active Problems: Hyperemesis with volume depletion: Vomiting controlled with ondansetron,  volume status improved with IVF and now advancing liquid diet as tolerated. Pain complaints improved with single dose ativan suggesting anxiety contribution and given history. Workup also completed to r/o other acute GI processes including ulcer, cholecystitis, appendicitis, pancreatitis. - D/C PRN morphine - Continue ondansetron  IV q4hrs PRN - Continue famotidine  IV q12hrs - Start ativan 0.5mg  IV q6hrs PRN  Constipation: Passed watery stools with partial resolution of abdominal pain. - D/C additional laxatives  Substance Use: Daily marijuana use for 6-7 years, discussed with patient and her mother at bedside. Denies EtOH or other drug use. Patient has been informed of association between marijuana use and nausea, vomiting, and abdominal pain symptoms. - Encourage substance use cessation, particularly in setting of repeated hyperemesis episodes  Generalized Anxiety Disorder: Patient previous on Lexapro, recently restarting. Resolution of chest pain with  ativan this AM. - Short course ativan PO PRN at discharge during acute symptoms  FEN advance liquid diet, multivitamin FULL CODE VTE ppx SCDs  Dispo: Anticipate patient to return home with her mother this afternoon if clinical improvement continues.  The patient does have a current PCP (per patient Marlana Latus in [place], [place]) and does need an [facility] hospital follow-up appointment after discharge.  The patient does not have transportation limitations that hinder transportation to clinic appointments.   Fuller Plan, MD 01/04/2015, 11:14 AM

## 2015-01-04 NOTE — Progress Notes (Signed)
Pt was complaining of chest pain MD on call informed and ordered EKG, troponin and ativan, v/s within normal, given ativan 1x and she slept good no s/s of chest pain noted.

## 2015-01-05 ENCOUNTER — Other Ambulatory Visit: Payer: Self-pay | Admitting: Internal Medicine

## 2015-01-05 DIAGNOSIS — R1115 Cyclical vomiting syndrome unrelated to migraine: Secondary | ICD-10-CM

## 2015-01-05 DIAGNOSIS — E876 Hypokalemia: Secondary | ICD-10-CM

## 2015-01-05 DIAGNOSIS — F411 Generalized anxiety disorder: Secondary | ICD-10-CM | POA: Diagnosis not present

## 2015-01-05 DIAGNOSIS — G43A1 Cyclical vomiting, intractable: Secondary | ICD-10-CM

## 2015-01-05 HISTORY — DX: Cyclical vomiting syndrome unrelated to migraine: R11.15

## 2015-01-05 MED ORDER — LORAZEPAM 0.5 MG PO TABS: 0.5000 mg | ORAL_TABLET | Freq: Four times a day (QID) | ORAL | Status: DC | PRN

## 2015-01-05 MED ORDER — FAMOTIDINE 20 MG PO TABS: 20.0000 mg | ORAL_TABLET | Freq: Two times a day (BID) | ORAL | Status: DC

## 2015-01-05 MED ORDER — ONDANSETRON 4 MG PO TBDP: 4.0000 mg | ORAL_TABLET | Freq: Three times a day (TID) | ORAL | Status: DC | PRN

## 2015-01-05 NOTE — Progress Notes (Signed)
Discharge home. Home discharge instruction given, no question verbalized. 

## 2015-01-05 NOTE — Progress Notes (Signed)
Initial Nutrition Assessment  DOCUMENTATION CODES:  Not applicable  INTERVENTION:  -Resource Breeze po BID, each supplement provides 250 kcal and 9 grams of protein -MVI daily  NUTRITION DIAGNOSIS:  Inadequate oral intake related to nausea, vomiting as evidenced by per patient/family report.   GOAL:  Patient will meet greater than or equal to 90% of their needs   MONITOR:  PO intake, Supplement acceptance, Labs, Weight trends, Skin, I & O's  REASON FOR ASSESSMENT:  Malnutrition Screening Tool    ASSESSMENT: Laura Frank is a 24 year old woman with a history of generalized anxiety disorder and marijuana abuse who is [redacted] weeks pregnant and presents to the emergency department with a five-day history of nausea, vomiting, and abdominal pain. She's been seen in the emergency department 2 times since the initiation of symptoms with resolution after IV Phenergan and dilaudid. She was given oral Phenergan to take home but this was no longer effective and she re-presented to the emergency department for further evaluation. She is admitted to the internal medicine teaching service for further care. Of note, part of her emergency department workup included an ultrasound and MRI of the abdomen which was unrevealing for a mechanical cause of her nausea, vomiting and abdominal pain.  Pt sleeping soundly at time of visit. Pt mother reports pt has been very drowsy and prefers pt not be woken up at this time; nutrition-focused physical exam deferred at this time.  Spoke with pt mother outside of the room. She reports that pt has been struggling with nausea and vomiting with PO intake. She reports that this has improved since hospitalization, however, PO intake continues to be poor. She reports that she was able to keep down peaches and applesauce this morning, but is concerned that this is not substantial nutrition. Noted a protein shake on pt bedside table.  Per chart review, pt is [redacted] weeks  pregnant. Per MD, hyperemesis is likely related to chronic marijuana use.  Pt mother requested more snacks (fruit and applesauce) to encourage pt's PO intake, which RD provided. Mom also reported pt will likely be discharged home later on today if she continues to tolerate diet.   Height:  Ht Readings from Last 1 Encounters:  01/01/15 5\' 3"  (1.6 m)    Weight:  Wt Readings from Last 1 Encounters:  01/01/15 110 lb (49.896 kg)    Ideal Body Weight:  52.2 kg  Wt Readings from Last 10 Encounters:  01/01/15 110 lb (49.896 kg)    BMI:  Estimated body mass index is 19.49 kg/(m^2) as calculated from the following:   Height as of 01/01/15: 5\' 3"  (1.6 m).   Weight as of 01/01/15: 110 lb (49.896 kg).  Estimated Nutritional Needs:  Kcal:  1500-1700  Protein:  55-65 grams  Fluid:  1.5-1.7 L  Skin:  Reviewed, no issues  Diet Order:  Diet regular Room service appropriate?: Yes; Fluid consistency:: Thin  EDUCATION NEEDS:  No education needs identified at this time   Intake/Output Summary (Last 24 hours) at 01/05/15 1315 Last data filed at 01/05/15 0820  Gross per 24 hour  Intake 3016.67 ml  Output      0 ml  Net 3016.67 ml    Last BM:  01/04/15  Kaneisha Ellenberger A. Mayford KnifeWilliams, RD, LDN, CDE Pager: [phone] After hours Pager: [phone]

## 2015-01-05 NOTE — Progress Notes (Signed)
Subjective: Developed nausea o/n without frank vomiting. Only able to intake thin fluids yesterday morning but tolerated soft solids PO by last night. Reports feeling painful pressure in chest/abdomen when anxiety is increased but resolved with ativan.  Objective: Vital signs in last 24 hours: Filed Vitals:   01/04/15 0421 01/04/15 1518 01/04/15 2140 01/05/15 0604  BP: 118/78 119/64 104/57 126/76  Pulse: 59 68 92 74  Temp: 98.4 F (36.9 C)  98.8 F (37.1 C) 98.7 F (37.1 C)  TempSrc: Oral  Oral Oral  Resp:  SpO2: 100% 98% 100% 100%   Weight change:   Intake/Output Summary (Last 24 hours) at 01/05/15 1130 Last data filed at 01/05/15 0820  Gross per 24 hour  Intake 3016.67 ml  Output      0 ml  Net 3016.67 ml   GENERAL- alert, co-operative, appears as stated age, not in any distress. HEENT- PERRL, EOMI, oral mucosa moist, good and intact dentition. No cervical lymphadenopathy, neck supple. CARDIAC- RRR, no murmurs, rubs or gallops. RESP- Moving equal volumes of air, and clear to auscultation bilaterally, no wheezes or crackles. ABDOMEN- Soft, no tenderness on palpation, no rebound tenderness, bowel sounds normoactive in all 4 quadrants NEURO- No obvious Cr N abnormality EXTREMITIES- pulse 2+, symmetric, no pedal edema. SKIN- Warm, dry, No rash or lesion. PSYCH- Appropriate mood and affect, appropriate thought content and speech.  Lab Results: Basic Metabolic Panel:  Recent Labs Lab 01/03/15 0539 01/03/15 1450 01/04/15 0420  NA 136  --  135  K 3.3*  --  3.9  CL 101  --  106  CO2 23  --  22  GLUCOSE 120*  --  110*  BUN <5*  --  <5*  CREATININE 0.76  --  0.60  CALCIUM 9.5  --  9.2  MG  --  2.2  --   PHOS  --  2.6  --    Liver Function Tests:  Recent Labs Lab 01/01/15 0827 01/03/15 0539  AST 30 22  ALT 25 20  ALKPHOS 53 50  BILITOT 0.9 0.4  PROT 7.0 6.7  ALBUMIN 4.4 4.2    Recent Labs Lab 01/01/15 0827 01/03/15 0539  LIPASE 23 27    CBC:  Recent Labs Lab 01/01/15 0827 01/03/15 0539 01/04/15 0420  WBC 16.7* 17.4* 12.3*  NEUTROABS 13.5* 13.1*  --   HGB 12.9 13.6 12.6  HCT 36.2 39.1 36.7  MCV 88.3 90.7 89.3  PLT 284 290 257   Cardiac Enzymes:  Recent Labs Lab 01/04/15 0420  TROPONINI <0.03   Thyroid Function Tests:  Recent Labs Lab 01/03/15 1450  TSH 1.385   Urine Drug Screen: Drugs of Abuse     Component Value Date/Time   LABOPIA NONE DETECTED 01/03/2015 0630   COCAINSCRNUR NONE DETECTED 01/03/2015 0630   LABBENZ NONE DETECTED 01/03/2015 0630   AMPHETMU NONE DETECTED 01/03/2015 0630   THCU POSITIVE* 01/03/2015 0630   LABBARB POSITIVE* 01/03/2015 0630    Alcohol Level:  Recent Labs Lab 01/03/15 1450  ETH <5   Urinalysis:  Recent Labs Lab 01/01/15 1114 01/03/15 0630  COLORURINE YELLOW YELLOW  LABSPEC 1.009 1.020  PHURINE 7.0 6.5  GLUCOSEU NEGATIVE NEGATIVE  HGBUR NEGATIVE NEGATIVE  BILIRUBINUR NEGATIVE NEGATIVE  KETONESUR 40* 15*  PROTEINUR NEGATIVE NEGATIVE  UROBILINOGEN 0.2 1.0  NITRITE NEGATIVE NEGATIVE  LEUKOCYTESUR NEGATIVE TRACE*   Micro Results: Recent Results (from the past 240 hour(s))  Wet prep, genital     Status: Abnormal  Collection Time: 01/01/15  9:35 AM  Result Value Ref Range Status   Yeast Wet Prep HPF POC NONE SEEN NONE SEEN Final   Trich, Wet Prep NONE SEEN NONE SEEN Final   Clue Cells Wet Prep HPF POC TOO NUMEROUS TO COUNT (A) NONE SEEN Final   WBC, Wet Prep HPF POC TOO NUMEROUS TO COUNT (A) NONE SEEN Final   Studies/Results:I have reviewed the patient's current medications. No results found. Medications:  Scheduled Meds: . famotidine (PEPCID) IV  20 mg Intravenous Q12H  . multivitamin  1 tablet Oral Daily   Continuous Infusions:  PRN Meds:.LORazepam, ondansetron **OR** ondansetron (ZOFRAN) IV, promethazine Assessment/Plan: Active Problems:   Intractable vomiting Assessment & Plan by Problem: Active Problems: Hyperemesis with volume  depletion: Nausea responding to ondansetron, notably patient now tolerating some solids PO. Has completed IVF volume and K replacement. - Transition famotidine, ondansetron, ativan to PO as tolerated  Substance Use: Daily marijuana use for 6-7 years, discussed with patient and her mother at bedside. Denies EtOH or other drug use. Patient has been informed of association between marijuana use and nausea, vomiting, and abdominal pain symptoms. - Encouraged substance use cessation, particularly in setting of repeated hyperemesis episodes within the past year  Generalized Anxiety Disorder: Patient previous on Lexapro, recently restarting.  Improvement of chest and abdominal pain with ativan suggest anxiety/panic disorder as an etiology. Volume status improved, and tolerating PO intake - Short course ativan PO PRN at discharge during acute symptoms, with planned PCP follow up on 01/09/15.  FEN advance diet as tolerated, multivitamin FULL CODE VTE ppx SCDs  The patient does not have transportation limitations that hinder transportation to clinic appointments.   Dispo: Patient is now tolerating improved PO intake and should be prepared for d/c home with her mother later today.  The patient does have a current PCP [facility] Abbe([facility] [place]) and [facility] after discharge on 01/09/15.  The patient does not have transportation limitations that hinder transportation to clinic appointments.  Fuller Planhristopher W Knolan Simien, MD 01/05/2015, 11:30 AM

## 2015-01-05 NOTE — Discharge Summary (Signed)
Name: Laura Frank MRN: [id number] DOB: 05/10/1991 24 y.o. PCP: No Pcp Per Patient  Date of Admission: 01/03/2015  5:21 AM Date of Discharge: 01/05/2015 Attending Physician: Doneen Poisson, MD  Discharge Diagnosis: 1. Intractable Vomiting 2. Generalized Anxiety Disorder 3. Hypokalemia  Discharge Medications:   Medication List    STOP taking these medications        dimenhyDRINATE 50 MG tablet  Commonly known as:  DRAMAMINE     docusate sodium 100 MG capsule  Commonly known as:  COLACE     HYDROcodone-acetaminophen 5-325 MG per tablet  Commonly known as:  NORCO     promethazine 25 MG tablet  Commonly known as:  PHENERGAN      TAKE these medications        escitalopram 10 MG tablet  Commonly known as:  LEXAPRO  Take 10 mg by mouth at bedtime.     famotidine 20 MG tablet  Commonly known as:  PEPCID  Take 1 tablet (20 mg total) by mouth 2 (two) times daily.     ibuprofen 200 MG tablet  Commonly known as:  ADVIL,MOTRIN  Take 200 mg by mouth every 6 (six) hours as needed for mild pain, moderate pain or cramping.     LORazepam 1 MG tablet  Commonly known as:  ATIVAN  Take 1 tablet (1 mg total) by mouth 2 (two) times daily as needed for anxiety.     ondansetron 4 MG disintegrating tablet  Commonly known as:  ZOFRAN-ODT  Take 1 tablet (4 mg total) by mouth every 8 (eight) hours as needed for nausea or vomiting.     polyethylene glycol packet  Commonly known as:  MIRALAX / GLYCOLAX  Take 17 g by mouth daily as needed for mild constipation or moderate constipation.     RECLIPSEN 0.15-30 MG-MCG tablet  Generic drug:  desogestrel-ethinyl estradiol  Take 1 tablet by mouth daily.        Disposition and follow-up:   Laura Frank was discharged from [facility] in [place] condition.  At the hospital follow up visit please address:  1. Generalized anxiety disorder with panic attacks- Restarting Lexapro and discharge and discharged with a short  course of ativan PRN lasting only until her PCP visit on 7/8.   2. Chronic marijuana use- Discontinuing or reducing use should be encouraged particularly in light of multiple episodes of extended vomiting that may represent cannabinoid hyperemesis/cyclical vomiting syndrome  3. Hyperemesis gravidarum- Pregnancy is a likely major contributor to both nausea and anxiety. Please discuss her plans at your visit. Her expressed goals during this admission were for termination with [facility] on 7/12  4. No labs or imaging require follow up  Follow-up Appointments:     Follow-up Information    Go on 01/09/2015 to follow up.   Why:  to your primary care provider.      Discharge Instructions: Discharge Instructions    Discharge instructions    Complete by:  As directed   Please follow up with your primary doctor or provider about anxiety as we think this is contributing to your symptoms during this hospital visit. Also strongly consider discontinuing marijuana use and if you still have these symptoms discuss with your primary care provider. We are sending you home with medication to help with these symptoms. 1. Pepcid/famotidine - Take 1 tablet twice daily 2. Lorazepam/Ativan - Take 1 tablet up to twice a day as needed, can take 1/2 tablet if this reduces drowsiness  3. Zofran/ondansetron - Take 1 tablet up to 3 times daily as needed for nausea If you decide to continue with this pregnancy please start taking prenatal vitamins. Please follow up with your primary care provider within the next 1-3 weeks.           Consultations: None  Procedures Performed:  Mr Abdomen Wo Contrast  01/01/2015   CLINICAL DATA:  Evaluate for appendicitis. Early gestation pregnancy. Presents today with continued/worsening pain in peri umbilical area.  EXAM: MRI ABDOMEN WITHOUT CONTRAST  TECHNIQUE: Multiplanar multisequence MR imaging was performed without the administration of intravenous contrast.   COMPARISON:  None  FINDINGS: Note: Exam incomplete. The patient became nauseous and vomited toward end of scan and did not want to continue further.  Lower chest:  No pleural effusion identified  Hepatobiliary: The visualized portions of the liver appear normal. The gallbladder is unremarkable. No biliary dilatation.  Pancreas: Normal appearance of the pancreas.  Spleen: The visualized portions of the spleen are normal.  Adrenals/Urinary Tract: The adrenal glands are unremarkable. Both kidneys appear normal. No obstructive uropathy. Urinary bladder appears normal.  Stomach/Bowel: The stomach is within normal limits. The small bowel loops have a normal course and caliber. No obstruction. Normal appearance of the colon. The appendix is not visualized separate from the right lower quadrant bowel loops. There are no secondary signs of acute appendicitis.  Vascular/Lymphatic: Normal appearance of the abdominal aorta. No enlarged upper abdominal lymph nodes. No pelvic or inguinal lymph nodes.  Reproductive: Gravid uterus compatible with first-trimester gestation.  Other: There is a small amount of free fluid noted within the pelvis.  Musculoskeletal: Normal signal from within the bone marrow.  IMPRESSION: 1. Exam detail diminished. The patient became nauseous and vomited toward end of scan and did not want to continue further. 2. Nonvisualization of the appendix. 3. Small amount of free fluid noted within the pelvis. 4. Gravid uterus consistent with appearance compatible with early gestation.   Electronically Signed   By: Signa Kell M.D.   On: 01/01/2015 18:58   US Ob Comp Less 14 Wks  01/01/2015   CLINICAL DATA:  Lower abdominal pain, positive pregnancy test, last normal menstrual period Nov 18, 2014.  EXAM: OBSTETRIC <14 WK [place] AND TRANSVAGINAL OB US  TECHNIQUE: Both transabdominal and transvaginal ultrasound examinations were performed for complete evaluation of the gestation as well as the maternal uterus,  adnexal regions, and pelvic cul-de-sac. Transvaginal technique was performed to assess early pregnancy.  COMPARISON:  None.  FINDINGS: Intrauterine gestational sac: Single common normal in shape  Yolk sac:  Present  Embryo:  Present  Cardiac Activity: Present  Heart Rate: 116  bpm  CRL:  3.5  mm   6 w   0 d                  [place] EDC: August 27, 2015  Maternal uterus/adnexae: No subchorionic hemorrhage is observed. The maternal ovaries are unremarkable.  IMPRESSION: There is a normal appearing early IUP with estimated gestational age of [redacted] weeks 0 days with estimated date of confinement of August 27, 2015.   Electronically Signed   By: David  [place] M.D.   On: 01/01/2015 11:08   US Ob Transvaginal  01/01/2015   CLINICAL DATA:  Lower abdominal pain, positive pregnancy test, last normal menstrual period Nov 18, 2014.  EXAM: OBSTETRIC <14 WK [place] AND TRANSVAGINAL OB US  TECHNIQUE: Both transabdominal and transvaginal ultrasound examinations were performed for complete evaluation of  the gestation as well as the maternal uterus, adnexal regions, and pelvic cul-de-sac. Transvaginal technique was performed to assess early pregnancy.  COMPARISON:  None.  FINDINGS: Intrauterine gestational sac: Single common normal in shape  Yolk sac:  Present  Embryo:  Present  Cardiac Activity: Present  Heart Rate: 116  bpm  CRL:  3.5  mm   6 w   0 d                  US EDC: August 27, 2015  Maternal uterus/adnexae: No subchorionic hemorrhage is observed. The maternal ovaries are unremarkable.  IMPRESSION: There is a normal appearing early IUP with estimated gestational age of [redacted] weeks 0 days with estimated date of confinement of August 27, 2015.   Electronically Signed   By: David  [place] M.D.   On: 01/01/2015 11:08    Admission HPI: Laura Frank is a 24 year old woman with a history of generalized anxiety disorder and marijuana abuse who is [redacted] weeks pregnant and presents to the emergency department with a five-day history of  nausea, vomiting, and abdominal pain. She's been seen in the emergency department 2 times since the initiation of symptoms with resolution after IV Phenergan and dilaudid. She was given oral Phenergan to take home but this was no longer effective and she re-presented to the emergency department for further evaluation. She is admitted to the internal medicine teaching service for further care. Of note, part of her emergency department workup included an ultrasound and MRI of the abdomen which was unrevealing for a mechanical cause of her nausea, vomiting and abdominal pain.  Hospital Course by problem list:  1) Hyperemesis: Presented to ED 7/2, previously seen at ED on 6/28 and 6/30 for similar complaints. At ED she underwent transvaginal ultrasound confirmed intrauterine 6 week pregnancy. MRI was technically limited but showed no bowel distention, pancreatic inflammation, biliary inflammation, or free air. FOBT studies were negative and Hgb at 13.6. Initially leukocytosis presented at 17.4 but decreased to 13 1 day after admission. She received morphine and Phenergan intravenously for pain and nausea control. She was switched after admission to Pepcid and Zofran with comparable symptom control and less sedation. She was unable to tolerate solid PO intake including swallowed pills from admission on 7/2 until the evening of 01/04/15. On 7/4 medications were changed to PO route. She was discharged with famotidine 20mg  PO BID and Zofran 4mg  PO TID PRN for symptom control until a follow up visit with her PCP on 01/09/15.  2) Volume depletion: BP 99/49, pulse 97, moderately dry mucosa on initial examination and subjectively weak, with hypoklemia of 3.3 on admission. She received 2L NS in the ED with correction of vital sign abnormalities. IV NS with KCl totaling 120 mEq was given during the admission with resolution of hypokalemia.  3) Generalized anxiety disorder: Lexapro was withheld during nausea workup, as it had  been reportedly restarted 6/22. On 7/3 while experiencing chest pain an EKG was obtained showing no abnormalities. 0.5mg  ativan was administered intravenously a total of 5 times, each time significantly resolving her chest pain. She will be discharged with a 3 day course of PRN ativan bridging her anxiety management and restarting her Lexapro until seeing her PCP.  Discharge Vitals:   BP 112/66 mmHg  Pulse 84  Temp(Src) 98.7 F (37.1 C) (Oral)  Resp 17  SpO2 100%  LMP 11/18/2014  Discharge Labs:  No results found for this or any previous visit (from the past 24 hour(s)).  Signed: Fuller Plan, MD 01/05/2015, 2:30 PM

## 2015-01-09 ENCOUNTER — Encounter (HOSPITAL_COMMUNITY): Payer: Self-pay | Admitting: Emergency Medicine

## 2015-01-09 ENCOUNTER — Emergency Department (HOSPITAL_COMMUNITY)
Admission: EM | Admit: 2015-01-09 | Discharge: 2015-01-09 | Disposition: A | Discharge status: Home / Self Care | Payer: 59 | Attending: Emergency Medicine | Admitting: Emergency Medicine

## 2015-01-09 DIAGNOSIS — R1013 Epigastric pain: Secondary | ICD-10-CM | POA: Diagnosis not present

## 2015-01-09 DIAGNOSIS — F411 Generalized anxiety disorder: Secondary | ICD-10-CM | POA: Insufficient documentation

## 2015-01-09 DIAGNOSIS — F410 Panic disorder [episodic paroxysmal anxiety] without agoraphobia: Secondary | ICD-10-CM | POA: Diagnosis not present

## 2015-01-09 DIAGNOSIS — R112 Nausea with vomiting, unspecified: Secondary | ICD-10-CM | POA: Insufficient documentation

## 2015-01-09 DIAGNOSIS — Z79899 Other long term (current) drug therapy: Secondary | ICD-10-CM | POA: Insufficient documentation

## 2015-01-09 DIAGNOSIS — R109 Unspecified abdominal pain: Secondary | ICD-10-CM | POA: Diagnosis present

## 2015-01-09 HISTORY — DX: Panic disorder (episodic paroxysmal anxiety): F41.0

## 2015-01-09 HISTORY — DX: Cyclical vomiting syndrome unrelated to migraine: R11.15

## 2015-01-09 HISTORY — DX: Cannabis use, unspecified, uncomplicated: F12.90

## 2015-01-09 LAB — COMPREHENSIVE METABOLIC PANEL
ALT: 16 U/L (ref 14–54)
AST: 25 U/L (ref 15–41)
Albumin: 4.3 g/dL (ref 3.5–5.0)
Alkaline Phosphatase: 57 U/L (ref 38–126)
Anion gap: 13 (ref 5–15)
BUN: <5 mg/dL — ABNORMAL LOW (ref 6–20)
CALCIUM: 10.1 mg/dL (ref 8.9–10.3)
CO2: 19 mmol/L — ABNORMAL LOW (ref 22–32)
CREATININE: 0.66 mg/dL (ref 0.44–1.00)
Chloride: 102 mmol/L (ref 101–111)
GFR calc non Af Amer: >60 mL/min (ref >60)
GLUCOSE: 136 mg/dL — AB (ref 65–99)
Potassium: 3.8 mmol/L (ref 3.5–5.1)
SODIUM: 134 mmol/L — AB (ref 135–145)
TOTAL PROTEIN: 6.9 g/dL (ref 6.5–8.1)
Total Bilirubin: 0.5 mg/dL (ref 0.3–1.2)

## 2015-01-09 LAB — URINALYSIS, ROUTINE W REFLEX MICROSCOPIC
Bilirubin Urine: NEGATIVE
GLUCOSE, UA: NEGATIVE mg/dL
HGB URINE DIPSTICK: NEGATIVE
Ketones, ur: 15 mg/dL — AB
Leukocytes, UA: NEGATIVE
Nitrite: NEGATIVE
Protein, ur: NEGATIVE mg/dL
SPECIFIC GRAVITY, URINE: 1.016 (ref 1.005–1.030)
Urobilinogen, UA: 0.2 mg/dL (ref 0.0–1.0)
pH: 8.5 — ABNORMAL HIGH (ref 5.0–8.0)

## 2015-01-09 LAB — CBC WITH DIFFERENTIAL/PLATELET
Basophils Absolute: 0 10*3/uL (ref 0.0–0.1)
Basophils Relative: 0 % (ref 0–1)
EOS ABS: 0 10*3/uL (ref 0.0–0.7)
EOS PCT: 0 % (ref 0–5)
HCT: 40.2 % (ref 36.0–46.0)
Hemoglobin: 14.1 g/dL (ref 12.0–15.0)
LYMPHS ABS: 1.4 10*3/uL (ref 0.7–4.0)
Lymphocytes Relative: 9 % — ABNORMAL LOW (ref 12–46)
MCH: 31.6 pg (ref 26.0–34.0)
MCHC: 35.1 g/dL (ref 30.0–36.0)
MCV: 90.1 fL (ref 78.0–100.0)
Monocytes Absolute: 0.5 10*3/uL (ref 0.1–1.0)
Monocytes Relative: 3 % (ref 3–12)
Neutro Abs: 14.5 10*3/uL — ABNORMAL HIGH (ref 1.7–7.7)
Neutrophils Relative %: 88 % — ABNORMAL HIGH (ref 43–77)
PLATELETS: 275 10*3/uL (ref 150–400)
RBC: 4.46 MIL/uL (ref 3.87–5.11)
RDW: 12.9 % (ref 11.5–15.5)
WBC: 16.5 10*3/uL — AB (ref 4.0–10.5)

## 2015-01-09 LAB — LIPASE, BLOOD: LIPASE: 20 U/L — AB (ref 22–51)

## 2015-01-09 MED ORDER — LORAZEPAM 1 MG PO TABS: 1.0000 mg | ORAL_TABLET | Freq: Two times a day (BID) | ORAL | Status: DC | PRN

## 2015-01-09 MED ORDER — SODIUM CHLORIDE 0.9 % IV BOLUS (SEPSIS)
1000.0000 mL | Freq: Once | INTRAVENOUS | Status: AC
  Administered 2015-01-09: 1000 mL via INTRAVENOUS

## 2015-01-09 MED ORDER — FAMOTIDINE IN NACL 20-0.9 MG/50ML-% IV SOLN
20.0000 mg | Freq: Once | INTRAVENOUS | Status: AC
  Administered 2015-01-09: 20 mg via INTRAVENOUS
  Filled 2015-01-09: qty 50

## 2015-01-09 MED ORDER — ACETAMINOPHEN 325 MG PO TABS
650.0000 mg | ORAL_TABLET | Freq: Once | ORAL | Status: AC
  Administered 2015-01-09: 650 mg via ORAL
  Filled 2015-01-09: qty 2

## 2015-01-09 MED ORDER — PROMETHAZINE HCL 25 MG RE SUPP: 25.0000 mg | Freq: Four times a day (QID) | RECTAL | Status: DC | PRN

## 2015-01-09 MED ORDER — PROMETHAZINE HCL 25 MG/ML IJ SOLN
12.5000 mg | Freq: Once | INTRAMUSCULAR | Status: AC
  Administered 2015-01-09: 12.5 mg via INTRAVENOUS
  Filled 2015-01-09: qty 1

## 2015-01-09 MED ORDER — LORAZEPAM 2 MG/ML IJ SOLN
0.5000 mg | Freq: Once | INTRAMUSCULAR | Status: AC
  Administered 2015-01-09: 0.5 mg via INTRAVENOUS
  Filled 2015-01-09: qty 1

## 2015-01-09 NOTE — ED Notes (Signed)
Pt asleep, family member asleep

## 2015-01-09 NOTE — ED Notes (Addendum)
Pt presents from home via POV with c/o nausea, dry heaving, burning abdominal/chest pain that starts in epigastric area. States was admitted for observation this past sat-Monday.  She says she has been taking phenergan, zofran, ativan, and lexapro to quell her "extreme anxiety" and pain. Pt reports last marijuana use was yesterday -- was counseled on her admission last week that marijuana may be a cause.

## 2015-01-09 NOTE — ED Provider Notes (Signed)
CSN: [id number]     Arrival date & time 01/09/15  1103 History   First MD Initiated Contact with Patient 01/09/15 1108     Chief Complaint  Patient presents with  . Abdominal Pain  . Nausea      HPI  Pt was seen at 1115. Per pt, c/o gradual onset and persistence of multiple intermittent episodes of N/V that began approximately 0500 this morning. Has been associated with upper abd "pain" which radiates into her chest. LD marijuana last night. Pt was discharged from the hospital on 01/05/15 for same symptoms, dx marijuana induced emesis. Pt states she "tried to take the medicines" she was rx on hospital discharge but "vomited them up."  Denies diarrhea, no CP/SOB, no back pain, no fevers, no black or blood in stools or emesis, no dysuria/hematuria, no vaginal bleeding/discharge, no pelvic pain. Hx G1P0, LMP 11/18/14, with EGA [redacted] weeks 3/7 days.     Past Medical History  Diagnosis Date  . Generalized anxiety disorder   . Cyclic vomiting syndrome 01/05/2015    marijuana induced emesis  . Marijuana use     daily  . Panic attacks    History reviewed. No pertinent past surgical history.  History  Substance Use Topics  . Smoking status: Never Smoker   . Smokeless tobacco: Not on file  . Alcohol Use: No   OB History    Gravida Para Term Preterm AB TAB SAB Ectopic Multiple Living   1              Review of Systems ROS: Statement: All systems negative except as marked or noted in the HPI; Constitutional: Negative for fever and chills. ; ; Eyes: Negative for eye pain, redness and discharge. ; ; ENMT: Negative for ear pain, hoarseness, nasal congestion, sinus pressure and sore throat. ; ; Cardiovascular: Negative for chest pain, palpitations, diaphoresis, dyspnea and peripheral edema. ; ; Respiratory: Negative for cough, wheezing and stridor. ; ; Gastrointestinal: +N/V, abd pain. Negative for diarrhea, blood in stool, hematemesis, jaundice and rectal bleeding. . ; ; Genitourinary: Negative for  dysuria, flank pain and hematuria. ; ; GYN:  No vaginal bleeding, no vaginal discharge, no vulvar pain. ;; Musculoskeletal: Negative for back pain and neck pain. Negative for swelling and trauma.; ; Skin: Negative for pruritus, rash, abrasions, blisters, bruising and skin lesion.; ; Neuro: Negative for headache, lightheadedness and neck stiffness. Negative for weakness, altered level of consciousness , altered mental status, extremity weakness, paresthesias, involuntary movement, seizure and syncope.     Allergies  Review of patient's allergies indicates no known allergies.  Home Medications   Prior to Admission medications   Medication Sig Start Date End Date Taking? Authorizing Provider  escitalopram (LEXAPRO) 10 MG tablet Take 10 mg by mouth at bedtime. 12/20/14  Yes Historical Provider, MD  famotidine (PEPCID) 20 MG tablet Take 1 tablet (20 mg total) by mouth 2 (two) times daily. 01/05/15  Yes Ejiroghene Wendall StadeE Emokpae, MD  ibuprofen (ADVIL,MOTRIN) 200 MG tablet Take 200 mg by mouth every 6 (six) hours as needed for mild pain, moderate pain or cramping.   Yes Historical Provider, MD  LORazepam (ATIVAN) 1 MG tablet Take 1 tablet (1 mg total) by mouth 2 (two) times daily as needed for anxiety. 01/04/15  Yes Ejiroghene E Emokpae, MD  ondansetron (ZOFRAN-ODT) 4 MG disintegrating tablet Take 1 tablet (4 mg total) by mouth every 8 (eight) hours as needed for nausea or vomiting. 01/05/15  Yes Ejiroghene Wendall StadeE Emokpae, MD  promethazine (PHENERGAN) 25 MG tablet Take 25 mg by mouth every 6 (six) hours as needed for nausea or vomiting.   Yes Historical Provider, MD  RECLIPSEN 0.15-30 MG-MCG tablet Take 1 tablet by mouth daily. 11/05/14  Yes Historical Provider, MD  polyethylene glycol (MIRALAX / GLYCOLAX) packet Take 17 g by mouth daily as needed for mild constipation or moderate constipation.    Historical Provider, MD   BP 127/68 mmHg  Pulse 93  Temp(Src) 97.8 F (36.6 C) (Oral)  Resp 20  Ht  (1.6 m)  Wt 110  lb (49.896 kg)  BMI 19.49 kg/m2  SpO2 99%  LMP 11/18/2014    13:29 Orthostatic Vital Signs MM  Orthostatic Lying  - BP- Lying: 132/73 mmHg ; Pulse- Lying: 71  Orthostatic Sitting - BP- Sitting: 136/80 mmHg ; Pulse- Sitting: 70  Orthostatic Standing at 0 minutes - BP- Standing at 0 minutes: 130/81 mmHg ; Pulse- Standing at 0 minutes: 82      Physical Exam  1120: Physical examination:  Nursing notes reviewed; Vital signs and O2 SAT reviewed;  Constitutional: Well developed, Well nourished, Well hydrated, Uncomfortable appearing; Head:  Normocephalic, atraumatic; Eyes: EOMI, PERRL, No scleral icterus; ENMT: Mouth and pharynx normal, Mucous membranes moist; Neck: Supple, Full range of motion, No lymphadenopathy; Cardiovascular: Regular rate and rhythm, No gallop; Respiratory: Breath sounds clear & equal bilaterally, No wheezes. Intermittently hyperventilating. Speaking full sentences with ease, Normal respiratory effort/excursion; Chest: Nontender, Movement normal; Abdomen: Soft, +dry heaving during exam. +mid-epigastric tenderness to palp. Nondistended, Normal bowel sounds; Genitourinary: No CVA tenderness; Extremities: Pulses normal, No tenderness, No edema, No calf edema or asymmetry.; Neuro: AA&Ox3, Major CN grossly intact.  Speech clear. No gross focal motor or sensory deficits in extremities. Climbs on and off stretcher easily by herself. Gait steady.; Skin: Color normal, Warm, Dry.; Psych:  Anxious.    ED Course  Procedures      EKG Interpretation None      MDM  MDM Reviewed: previous chart, nursing note and vitals Reviewed previous: labs, MRI and ultrasound Interpretation: labs   Results for orders placed or performed during the hospital encounter of 01/09/15  Urinalysis, Routine w reflex microscopic (not at [facility])  Result Value Ref Range   Color, Urine YELLOW YELLOW   APPearance CLEAR CLEAR   Specific Gravity, Urine 1.016 1.005 - 1.030   pH 8.5 (H) 5.0 - 8.0   Glucose,  UA NEGATIVE NEGATIVE mg/dL   Hgb urine dipstick NEGATIVE NEGATIVE   Bilirubin Urine NEGATIVE NEGATIVE   Ketones, ur 15 (A) NEGATIVE mg/dL   Protein, ur NEGATIVE NEGATIVE mg/dL   Urobilinogen, UA 0.2 0.0 - 1.0 mg/dL   Nitrite NEGATIVE NEGATIVE   Leukocytes, UA NEGATIVE NEGATIVE  Comprehensive metabolic panel  Result Value Ref Range   Sodium 134 (L) 135 - 145 mmol/L   Potassium 3.8 3.5 - 5.1 mmol/L   Chloride 102 101 - 111 mmol/L   CO2 19 (L) 22 - 32 mmol/L   Glucose, Bld 136 (H) 65 - 99 mg/dL   BUN <5 (L) 6 - 20 mg/dL   Creatinine, Ser 1.61 0.44 - 1.00 mg/dL   Calcium 09.6 8.9 - 04.5 mg/dL   Total Protein 6.9 6.5 - 8.1 g/dL   Albumin 4.3 3.5 - 5.0 g/dL   AST 25 15 - 41 U/L   ALT 16 14 - 54 U/L   Alkaline Phosphatase 57 38 - 126 U/L   Total Bilirubin 0.5 0.3 - 1.2 mg/dL  GFR calc non Af Amer >60 >60 mL/min   GFR calc Af Amer >60 >60 mL/min   Anion gap 13 5 - 15  Lipase, blood  Result Value Ref Range   Lipase 20 (L) 22 - 51 U/L  CBC with Differential  Result Value Ref Range   WBC 16.5 (H) 4.0 - 10.5 K/uL   RBC 4.46 3.87 - 5.11 MIL/uL   Hemoglobin 14.1 12.0 - 15.0 g/dL   HCT 40.9 81.1 - 91.4 %   MCV 90.1 78.0 - 100.0 fL   MCH 31.6 26.0 - 34.0 pg   MCHC 35.1 30.0 - 36.0 g/dL   RDW 78.2 95.6 - 21.3 %   Platelets 275 150 - 400 K/uL   Neutrophils Relative % 88 (H) 43 - 77 %   Neutro Abs 14.5 (H) 1.7 - 7.7 K/uL   Lymphocytes Relative 9 (L) 12 - 46 %   Lymphs Abs 1.4 0.7 - 4.0 K/uL   Monocytes Relative 3 3 - 12 %   Monocytes Absolute 0.5 0.1 - 1.0 K/uL   Eosinophils Relative 0 0 - 5 %   Eosinophils Absolute 0.0 0.0 - 0.7 K/uL   Basophils Relative 0 0 - 1 %   Basophils Absolute 0.0 0.0 - 0.1 K/uL      1125:  Pt states she was "doing ok" and "the medicines were working" (zofran, phenergan, ativan, lexapro) after her discharge. Pt used marijuana last night and her symptoms began early this morning. As per her recent admission notes (ED and inpt service), pt counseled again  regarding marijuana as possible cause for her symptoms. Pt verb understanding. Pt had MRI abd as inpt, as well as US pelvis; will not repeat imaging study today. Will check UA, labs, tx symptomatically.   1520:  Pt has ambulated with steady gait, easy resps, NAD. Pt has tol PO well without N/V. Pt states she "wants to go home now." Pt and her mother state pt had an appt with her PMD in [place] this afternoon, but has missed it due to coming to the ED. States she has rescheduled the appt for Monday. Pt requesting ativan "to get through until then." Will also rx phenergan suppositories. Dx and testing d/w pt and family.  Questions answered.  Verb understanding, agreeable to d/c home with outpt f/u.  1550:  Pt requesting "something for pain." Pt and family aware that I will not dose narcotic pain medications, as pt is pregnant.  Pt and family verb understanding and agreeable to dose tylenol before discharge.   Samuel Jester, DO 01/11/15 1145

## 2015-01-09 NOTE — ED Notes (Signed)
MD at bedside. 

## 2015-01-09 NOTE — Discharge Instructions (Signed)
°Emergency Department Resource Guide °1) Find a Doctor and Pay Out of Pocket °Although you won't have to find out who is covered by your insurance plan, it is a good idea to ask around and get recommendations. You will then need to call the office and see if the doctor you have chosen will accept you as a new patient and what types of options they offer for patients who are self-pay. Some doctors offer discounts or will set up payment plans for their patients who do not have insurance, but you will need to ask so you aren't surprised when you get to your appointment. ° °2) Contact Your Local Health Department °Not all health departments have doctors that can see patients for sick visits, but many do, so it is worth a call to see if yours does. If you don't know where your local health department is, you can check in your phone book. The CDC also has a tool to help you locate your state's health department, and many state websites also have listings of all of their local health departments. ° °3) Find a Walk-in Clinic °If your illness is not likely to be very severe or complicated, you may want to try a walk in clinic. These are popping up all over the country in pharmacies, drugstores, and shopping centers. They're usually staffed by nurse practitioners or physician assistants that have been trained to treat common illnesses and complaints. They're usually fairly quick and inexpensive. However, if you have serious medical issues or chronic medical problems, these are probably not your best option. ° °No Primary Care Doctor: °- Call [facility] at  832-8000 - they can help you locate a primary care doctor that  accepts your insurance, provides certain services, etc. °- Physician Referral Service- 1-800-533-3463 ° °Chronic Pain Problems: °Organization         Address  Phone   Notes  °[facility]  (336) 297-2271 Patients need to be referred by their primary care doctor.  ° °Medication  Assistance: °Organization         Address  Phone   Notes  °[place] [facility] [address] °[place], [place] [address] (336) 641-8030 --Must be a resident of [place] °-- Must have NO insurance coverage whatsoever (no Medicaid/ Medicare, etc.) °-- The pt. MUST have a primary care doctor that directs their care regularly and follows them in the community °  °[facility]  (866) 331-1348   °[employer]  (888) 892-1162   ° °Agencies that provide inexpensive medical care: °Organization         Address  Phone   Notes  °[facility]  (336) 832-8035   °[facility]    (336) 832-7272   °[facility] [address] °[place], [place] [address] (336) 832-4777   °[facility] [address], °[place] (336) 271-4999   °[facility]    (336) 373-0678   °[facility]    (336) 272-1050   °[facility] ° [address], [place] Phone:  (336) 832-4444, Fax:  (336) 832-4440 Hours of Operation:  9 am - 6 pm, M-F.  Also accepts Medicaid/Medicare and self-pay.  °[facility] ° [address], [place] Phone: (336) 832-3150, Fax: (336) [phone]. Hours of Operation:  8:30 am - 5:30 pm, M-F.  Also accepts Medicaid and self-pay.  °[facility] [place] [address], [place] Phone: (336) 878-6027   °[facility] [address], [place], [place] (336)723-1848, Ext. 123 Mondays & Thursdays: 7-9 AM.  First 15 patients are seen on a first come, first serve basis. °  ° °Medicaid-accepting [place] Providers: ° °Organization         Address  Phone   Notes  °[facility] [address], [place] (336) 641-2100 Also accepts self-pay patients.  °[facility] [address], [place] ° (336) 856-9996   °[facility] [address], [place]  (336) 288-8857   °[facility] [address] [place] [address], [place] (336) 299-7000   °Veita Bland [address], [place]  ° (336) 373-1557 Only accepts [place] Access Medicaid patients after they have their name applied to their card.  ° °Self-Pay (no insurance) in [place]: ° °Organization         Address  Phone   Notes  °Sickle Cell Patients, [facility] [address], [place] (336) 832-1970   °[facility] [address], [place] (336) 832-4400   °[facility] ° [address], [place] (336) 992-4800   °[facility]/Dr. Osei-Bonsu ° [address] [place] [address], [place] or [address], [place] (336) 841-8500 Phone number for both [place] and [place] locations is the same.  °[facility] [address], [place] (336) 299-0000   °[facility] [address] [place] [address], [place] or [address] (336) 852-7530 °(336) 878-2260   °[facility] [address], [place] (336) 350-1642, phone; (336) 294-5005, fax Sees patients 1st and 3rd Saturday of every month.  Must not qualify for public or private insurance (i.e. Medicaid, Medicare, [place] Health Choice, Veterans' Benefits) • Household income should be no more than 200% of the poverty level •The clinic cannot treat you if you are pregnant or think you are pregnant • Sexually transmitted diseases are not treated at the clinic.  ° ° °Dental Care: °Organization         Address  Phone  Notes  °[place] [facility] [address], [place] (336) 641-6152 Accepts children up to age 21 who are enrolled in Medicaid or [place] Health Choice; pregnant women with a Medicaid card; and children who have applied for Medicaid or [place] Health Choice, but were declined, whose parents can pay a reduced fee at time of service.  °[place]  [facility] [place]  [address], [place] (336) 641-7733 Accepts children up to age 21 who are enrolled in Medicaid or [place] Health Choice; pregnant women with a Medicaid card; and children who have applied for Medicaid or [place] Health Choice, but were declined, whose parents can pay a reduced fee at time of service.  °[facility] ° [address], [place] (336) 641-4533 Patients are seen by appointment only. Walk-ins are not accepted. [facility] will see patients 18 years of age and older. °Monday - Tuesday (8am-5pm) °Most Wednesdays (8:30-5pm) °$30 per visit, cash only  °[facility] ° [address], [place] (336) 641-4533 Patients are seen by appointment only. Walk-ins are not accepted. [facility] will see patients 18 years of age and older. °One   Wednesday Evening (Monthly: Volunteer Based).  $30 per visit, cash only  °[facility]  (919) [phone] for adults; Children under age 4, call Graduate Pediatric Dentistry at ([phone]) [phone]. Children aged 4-14, please call ([phone]) [phone] to request a pediatric application. ° Dental services are provided in all areas of dental care including fillings, crowns and bridges, complete and partial dentures, implants, gum treatment, root canals, and extractions. Preventive care is also provided. Treatment is provided to both adults and children. °Patients are selected via a lottery and there is often a waiting list. °  °[facility] [address], °[place] ° (336) 763-8833 [email or web address] °  °[facility] [address], [place], [place] (336)723-1848, Ext. 123 Second and Fourth Thursday of each month, opens at 6:30 AM; Clinic ends at 9 AM.  Patients are seen on a first-come first-served basis, and a limited number are seen during each clinic.  ° °[facility] ° [address], [place], [place] (336) 723-7904    Eligibility Requirements °You must have lived in [place], [place], or [place] counties for at least the last three months. °  You cannot be eligible for state or federal sponsored healthcare insurance, including Veterans Administration, Medicaid, or Medicare. °  You generally cannot be eligible for healthcare insurance through your employer.  °  How to apply: °Eligibility screenings are held every Tuesday and Wednesday afternoon from 1:00 pm until 4:00 pm. You do not need an appointment for the interview!  °[facility] [address], [place], [place] 336-631-2330   °[facility]  336-342-8273   °[facility]  336-703-3100   °[facility]  336-570-6415   ° °Behavioral Health Resources in the Community: °Intensive Outpatient Programs °Organization         Address  Phone  Notes  °[facility] [address], [place], [place] 336-878-6098   °[facility] [address], [place], [place] 336-832-9800   °[facility] [address], [place], [place] ° 336-882-2125   °[facility] [address],  °[place], [place] 1-800-853-5163 or 336-641-4981   °Substance Abuse Resources °Organization         Address  Phone  Notes  °[facility]  336-882-2125   °[facility]  336-784-9470   °[facility]  336-285-9073   °[facility]  336-845-3988   °Residential & Outpatient Substance Abuse Program  1-800-659-3381   °Psychological Services °Organization         Address  Phone  Notes  °[facility]  336- 832-9600   °[facility]  336- 378-7881   °[facility] [address], [place] 1-800-853-5163 or 336-641-4981   ° °Mobile Crisis Teams °Organization         Address  Phone  Notes  °[facility], Mobile Crisis Care Unit  1-877-626-1772   °Assertive °[facility] ° [address]  [place], [place] 336-834-9664   °Sharon DeEsch [address] °[place] 336-554-5454   ° °Self-Help/Support Groups °Organization         Address  Phone             Notes  °[facility]  - variety of support groups  336- 373-1402 Call for more information  °Narcotics Anonymous (NA), [facility] [address], °[place]  2 meetings at this location  ° °  Residential Treatment Programs Organization         Address  Phone  Notes  [facility] [address],    [place]  [phone]   [facility]  [address], [place] [phone], [place], [place] [phone]) [phone]   [facility] [address], [place] [phone] Admissions: 8am-3pm M-F  [facility] [address]    [place], [place] [phone]   [facility] [address], [place], [place] [phone]   [facility] [address]  [place], [place] [phone]   [facility] Intensive Outpatient [address], [place], [place] [phone]   [facility] ([facility] [address]  [place], [place] [phone] or [phone]   [facility]) [address] [place], [place] [phone])[phone] Accepts Medicaid  [facility] [address]  [place] [phone] Substance Abuse/Addiction Treatment   [facility] Organization         Address  Phone  Notes  [facility]  365-045-4229(888) (250) 314-9757   Angie FavaJulie Brannon, PhD [address], [address] [place], [place]   [phone](336) [phone] or [phone](336) (250) [phone]   [facility]   [address] [place], [place] [phone](336) [phone]   [facility] [address], [place], [place] ([phone](336) [phone] Insurance/Medicaid/sponsorship through [facility] [address]                                    [place], [place] [phone](336) [phone] Therapy/tele-psych/case    [facility] [address]   [place], [place] [phone]) [phone]    Dr. Lolly MustacheArfeen  [phone]) [phone]   [facility]  [facility] 1) [address], [place] 2) [address], [place] 3)  [address], [place] [phone](336) [phone] [phone])[phone](336) [phone]  (330)[phone](336) [phone]   [facility] Child Abuse Hotline [phone](336) (313) [phone] or (815)[phone](336) [phone] (After Hours)      Take the prescriptions as directed.  Increase your fluid intake (ie:  Gatoraide) for the next few days.  Eat a bland diet and advance to your regular diet slowly as you can tolerate it. Call your regular medical doctor today to confirm your previously schedule follow up appointment on Monday.  Return to the Emergency Department immediately if not improving (or even worsening) despite taking the medicines as prescribed, any black or bloody stool or vomit, if you develop a fever over "101," or for any other concerns.

## 2015-01-10 LAB — URINE CULTURE

## 2015-01-13 ENCOUNTER — Encounter (HOSPITAL_COMMUNITY): Payer: Self-pay | Admitting: *Deleted

## 2015-01-13 ENCOUNTER — Inpatient Hospital Stay (HOSPITAL_COMMUNITY)
Admission: AD | Admit: 2015-01-13 | Discharge: 2015-01-13 | Disposition: A | Discharge status: Home / Self Care | Payer: 59 | Source: Ambulatory Visit | Attending: Obstetrics and Gynecology | Admitting: Obstetrics and Gynecology

## 2015-01-13 ENCOUNTER — Inpatient Hospital Stay (HOSPITAL_COMMUNITY): Payer: 59

## 2015-01-13 DIAGNOSIS — O210 Mild hyperemesis gravidarum: Secondary | ICD-10-CM | POA: Diagnosis not present

## 2015-01-13 DIAGNOSIS — O99611 Diseases of the digestive system complicating pregnancy, first trimester: Secondary | ICD-10-CM | POA: Insufficient documentation

## 2015-01-13 DIAGNOSIS — O219 Vomiting of pregnancy, unspecified: Secondary | ICD-10-CM

## 2015-01-13 DIAGNOSIS — Z3A09 9 weeks gestation of pregnancy: Secondary | ICD-10-CM | POA: Insufficient documentation

## 2015-01-13 DIAGNOSIS — R1013 Epigastric pain: Secondary | ICD-10-CM | POA: Insufficient documentation

## 2015-01-13 DIAGNOSIS — R112 Nausea with vomiting, unspecified: Secondary | ICD-10-CM

## 2015-01-13 DIAGNOSIS — K219 Gastro-esophageal reflux disease without esophagitis: Secondary | ICD-10-CM | POA: Diagnosis not present

## 2015-01-13 DIAGNOSIS — R109 Unspecified abdominal pain: Secondary | ICD-10-CM | POA: Diagnosis present

## 2015-01-13 LAB — COMPREHENSIVE METABOLIC PANEL
ALBUMIN: 4.5 g/dL (ref 3.5–5.0)
ALT: 16 U/L (ref 14–54)
AST: 22 U/L (ref 15–41)
Alkaline Phosphatase: 53 U/L (ref 38–126)
Anion gap: 7 (ref 5–15)
BUN: 6 mg/dL (ref 6–20)
CALCIUM: 9.9 mg/dL (ref 8.9–10.3)
CO2: 24 mmol/L (ref 22–32)
CREATININE: 0.54 mg/dL (ref 0.44–1.00)
Chloride: 102 mmol/L (ref 101–111)
GFR calc Af Amer: >60 mL/min (ref >60)
Glucose, Bld: 119 mg/dL — ABNORMAL HIGH (ref 65–99)
Potassium: 4.1 mmol/L (ref 3.5–5.1)
SODIUM: 133 mmol/L — AB (ref 135–145)
Total Bilirubin: 0.7 mg/dL (ref 0.3–1.2)
Total Protein: 7.4 g/dL (ref 6.5–8.1)

## 2015-01-13 LAB — CBC
HEMATOCRIT: 38.4 % (ref 36.0–46.0)
Hemoglobin: 13.5 g/dL (ref 12.0–15.0)
MCH: 31.5 pg (ref 26.0–34.0)
MCHC: 35.2 g/dL (ref 30.0–36.0)
MCV: 89.7 fL (ref 78.0–100.0)
Platelets: 290 10*3/uL (ref 150–400)
RBC: 4.28 MIL/uL (ref 3.87–5.11)
RDW: 13.2 % (ref 11.5–15.5)
WBC: 19.4 10*3/uL — ABNORMAL HIGH (ref 4.0–10.5)

## 2015-01-13 LAB — URINALYSIS, ROUTINE W REFLEX MICROSCOPIC
Bilirubin Urine: NEGATIVE
GLUCOSE, UA: NEGATIVE mg/dL
HGB URINE DIPSTICK: NEGATIVE
Ketones, ur: 15 mg/dL — AB
LEUKOCYTES UA: NEGATIVE
NITRITE: NEGATIVE
Protein, ur: 30 mg/dL — AB
SPECIFIC GRAVITY, URINE: 1.015 (ref 1.005–1.030)
Urobilinogen, UA: 0.2 mg/dL (ref 0.0–1.0)

## 2015-01-13 LAB — DIFFERENTIAL
BASOS ABS: 0 10*3/uL (ref 0.0–0.1)
Basophils Relative: 0 % (ref 0–1)
EOS PCT: 0 % (ref 0–5)
Eosinophils Absolute: 0 10*3/uL (ref 0.0–0.7)
LYMPHS PCT: 8 % — AB (ref 12–46)
Lymphs Abs: 1.6 10*3/uL (ref 0.7–4.0)
MONO ABS: 0.6 10*3/uL (ref 0.1–1.0)
Monocytes Relative: 3 % (ref 3–12)
NEUTROS ABS: 17 10*3/uL — AB (ref 1.7–7.7)
NEUTROS PCT: 89 % — AB (ref 43–77)

## 2015-01-13 LAB — LIPASE, BLOOD: Lipase: 17 U/L — ABNORMAL LOW (ref 22–51)

## 2015-01-13 LAB — URINE MICROSCOPIC-ADD ON

## 2015-01-13 LAB — AMYLASE: Amylase: 62 U/L (ref 28–100)

## 2015-01-13 MED ORDER — OXYCODONE-ACETAMINOPHEN 5-325 MG PO TABS: 1.0000 | ORAL_TABLET | Freq: Four times a day (QID) | ORAL | Status: DC | PRN

## 2015-01-13 MED ORDER — PROMETHAZINE HCL 25 MG/ML IJ SOLN
25.0000 mg | Freq: Once | INTRAVENOUS | Status: AC
  Administered 2015-01-13: 25 mg via INTRAVENOUS
  Filled 2015-01-13: qty 1

## 2015-01-13 MED ORDER — HYDROMORPHONE HCL 1 MG/ML IJ SOLN
1.0000 mg | Freq: Once | INTRAMUSCULAR | Status: AC
  Administered 2015-01-13: 1 mg via INTRAVENOUS
  Filled 2015-01-13: qty 1

## 2015-01-13 MED ORDER — PROMETHAZINE HCL 25 MG RE SUPP: 25.0000 mg | Freq: Four times a day (QID) | RECTAL | Status: DC | PRN

## 2015-01-13 MED ORDER — PANTOPRAZOLE SODIUM 40 MG IV SOLR
40.0000 mg | Freq: Once | INTRAVENOUS | Status: AC
  Administered 2015-01-13: 40 mg via INTRAVENOUS
  Filled 2015-01-13: qty 40

## 2015-01-13 MED ORDER — ONDANSETRON 8 MG PO TBDP: 8.0000 mg | ORAL_TABLET | Freq: Three times a day (TID) | ORAL | Status: DC | PRN

## 2015-01-13 NOTE — Discharge Instructions (Signed)
Abdominal Pain °Many things can cause abdominal pain. Usually, abdominal pain is not caused by a disease and will improve without treatment. It can often be observed and treated at home. Your health care provider will do a physical exam and possibly order blood tests and X-rays to help determine the seriousness of your pain. However, in many cases, more time must pass before a clear cause of the pain can be found. Before that point, your health care provider may not know if you need more testing or further treatment. °HOME CARE INSTRUCTIONS  °Monitor your abdominal pain for any changes. The following actions may help to alleviate any discomfort you are experiencing: °· Only take over-the-counter or prescription medicines as directed by your health care provider. °· Do not take laxatives unless directed to do so by your health care provider. °· Try a clear liquid diet (broth, tea, or water) as directed by your health care provider. Slowly move to a bland diet as tolerated. °SEEK MEDICAL CARE IF: °· You have unexplained abdominal pain. °· You have abdominal pain associated with nausea or diarrhea. °· You have pain when you urinate or have a bowel movement. °· You experience abdominal pain that wakes you in the night. °· You have abdominal pain that is worsened or improved by eating food. °· You have abdominal pain that is worsened with eating fatty foods. °· You have a fever. °SEEK IMMEDIATE MEDICAL CARE IF:  °· Your pain does not go away within 2 hours. °· You keep throwing up (vomiting). °· Your pain is felt only in portions of the abdomen, such as the right side or the left lower portion of the abdomen. °· You pass bloody or black tarry stools. °MAKE SURE YOU: °· Understand these instructions.   °· Will watch your condition.   °· Will get help right away if you are not doing well or get worse.   °Document Released: 03/30/2005 Document Revised: 06/25/2013 Document Reviewed: 02/27/2013 °ExitCare® Patient Information  ©2015 [employer]. This information is not intended to replace advice given to you by your health care provider. Make sure you discuss any questions you have with your health care provider. ° °Gastroesophageal Reflux Disease, Adult °Gastroesophageal reflux disease (GERD) happens when acid from your stomach flows up into the esophagus. When acid comes in contact with the esophagus, the acid causes soreness (inflammation) in the esophagus. Over time, GERD may create small holes (ulcers) in the lining of the esophagus. °CAUSES  °· Increased body weight. This puts pressure on the stomach, making acid rise from the stomach into the esophagus. °· Smoking. This increases acid production in the stomach. °· Drinking alcohol. This causes decreased pressure in the lower esophageal sphincter (valve or ring of muscle between the esophagus and stomach), allowing acid from the stomach into the esophagus. °· Late evening meals and a full stomach. This increases pressure and acid production in the stomach. °· A malformed lower esophageal sphincter. °Sometimes, no cause is found. °SYMPTOMS  °· Burning pain in the lower part of the mid-chest behind the breastbone and in the mid-stomach area. This may occur twice a week or more often. °· Trouble swallowing. °· Sore throat. °· Dry cough. °· Asthma-like symptoms including chest tightness, shortness of breath, or wheezing. °DIAGNOSIS  °Your caregiver may be able to diagnose GERD based on your symptoms. In some cases, X-rays and other tests may be done to check for complications or to check the condition of your stomach and esophagus. °TREATMENT  °Your caregiver may   recommend over-the-counter or prescription medicines to help decrease acid production. Ask your caregiver before starting or adding any new medicines.  °HOME CARE INSTRUCTIONS  °· Change the factors that you can control. Ask your caregiver for guidance concerning weight loss, quitting smoking, and alcohol  consumption. °· Avoid foods and drinks that make your symptoms worse, such as: °¨ Caffeine or alcoholic drinks. °¨ Chocolate. °¨ Peppermint or mint flavorings. °¨ Garlic and onions. °¨ Spicy foods. °¨ Citrus fruits, such as oranges, lemons, or limes. °¨ Tomato-based foods such as sauce, chili, salsa, and pizza. °¨ Fried and fatty foods. °· Avoid lying down for the 3 hours prior to your bedtime or prior to taking a nap. °· Eat small, frequent meals instead of large meals. °· Wear loose-fitting clothing. Do not wear anything tight around your waist that causes pressure on your stomach. °· Raise the head of your bed 6 to 8 inches with wood blocks to help you sleep. Extra pillows will not help. °· Only take over-the-counter or prescription medicines for pain, discomfort, or fever as directed by your caregiver. °· Do not take aspirin, ibuprofen, or other nonsteroidal anti-inflammatory drugs (NSAIDs). °SEEK IMMEDIATE MEDICAL CARE IF:  °· You have pain in your arms, neck, jaw, teeth, or back. °· Your pain increases or changes in intensity or duration. °· You develop nausea, vomiting, or sweating (diaphoresis). °· You develop shortness of breath, or you faint. °· Your vomit is green, yellow, black, or looks like coffee grounds or blood. °· Your stool is red, bloody, or black. °These symptoms could be signs of other problems, such as heart disease, gastric bleeding, or esophageal bleeding. °MAKE SURE YOU:  °· Understand these instructions. °· Will watch your condition. °· Will get help right away if you are not doing well or get worse. °Document Released: 03/30/2005 Document Revised: 09/12/2011 Document Reviewed: 01/07/2011 °ExitCare® Patient Information ©2015 [employer]. This information is not intended to replace advice given to you by your health care provider. Make sure you discuss any questions you have with your health care provider. ° °

## 2015-01-13 NOTE — MAU Note (Addendum)
Dx with reflux ulcer yesterday at primary dr.  Pain in upper abd- has gotten worse in last 2 wks.  When woke up this morning, could not get ahead of pain and vomiting.  Took one percocet and one ativan, phenergan suppository this morning.  Prescribed prilosec and miralx daily

## 2015-01-13 NOTE — MAU Note (Signed)
Pt unable to void 

## 2015-01-13 NOTE — MAU Provider Note (Signed)
History     CSN: [id number]  Arrival date and time: 01/13/15 1007   First Provider Initiated Contact with Patient 01/13/15 1100      Chief Complaint  Patient presents with  . Abdominal Pain   HPI   HPI: Laura Frank is a 24 y.o. year old G1P0 female at [redacted]w[redacted]d weeks gestation who presents to MAU reporting severe epigastric pain, N/V. Was seen in Ed for same Sx 6/28, 6/30 and admitted 7/2-7/4. Dx. Cyclic vomiting, GAD. Followed up w/ PCP who Dx's GERD and Rx'd Prilosec (has not started). Planned endoscopy to eval for ulcers if no improvement on Prilosec per pt and her mother. Essentially normal Abd CT 01/01/15 except appendix not visualized.  Has appt for pregnancy termination not R/T to ongoing problems w/ N/V, abd pain.   OB History    Gravida Para Term Preterm AB TAB SAB Ectopic Multiple Living   1               Past Medical History  Diagnosis Date  . Generalized anxiety disorder   . Cyclic vomiting syndrome 01/05/2015    marijuana induced emesis  . Marijuana use     daily  . Panic attacks   . GERD (gastroesophageal reflux disease)     Past Surgical History  Procedure Laterality Date  . No past surgeries      History reviewed. No pertinent family history.  History  Substance Use Topics  . Smoking status: Never Smoker   . Smokeless tobacco: Not on file  . Alcohol Use: No    Allergies: No Known Allergies  Prescriptions prior to admission  Medication Sig Dispense Refill Last Dose  . escitalopram (LEXAPRO) 10 MG tablet Take 10 mg by mouth at bedtime.  1 01/12/2015 at Unknown time  . HYDROcodone-acetaminophen (NORCO/VICODIN) 5-325 MG per tablet Take 1 tablet by mouth every 6 (six) hours as needed for moderate pain.   01/13/2015 at Unknown time  . LORazepam (ATIVAN) 0.5 MG tablet Take 1 mg by mouth every 8 (eight) hours.   01/13/2015 at Unknown time  . omeprazole (PRILOSEC) 40 MG capsule Take 40 mg by mouth daily.   01/13/2015 at Unknown time  . famotidine (PEPCID) 20  MG tablet Take 1 tablet (20 mg total) by mouth 2 (two) times daily. (Patient not taking: Reported on 01/13/2015) 60 tablet 0 01/08/2015 at Unknown time  . LORazepam (ATIVAN) 1 MG tablet Take 1 tablet (1 mg total) by mouth 2 (two) times daily as needed for anxiety. (Patient not taking: Reported on 01/13/2015) 6 tablet 0   . promethazine (PHENERGAN) 25 MG tablet Take 25 mg by mouth every 6 (six) hours as needed for nausea or vomiting.   01/08/2015 at Unknown time  . RECLIPSEN 0.15-30 MG-MCG tablet Take 1 tablet by mouth daily.  3 Past Month at Unknown time  . [DISCONTINUED] ondansetron (ZOFRAN-ODT) 4 MG disintegrating tablet Take 1 tablet (4 mg total) by mouth every 8 (eight) hours as needed for nausea or vomiting. (Patient not taking: Reported on 01/13/2015) 20 tablet 0 01/09/2015 at Unknown time  . [DISCONTINUED] promethazine (PHENERGAN) 25 MG suppository Place 1 suppository (25 mg total) rectally every 6 (six) hours as needed for nausea or vomiting. (Patient not taking: Reported on 01/13/2015) 6 each 0     Review of Systems  Constitutional: Positive for weight loss. Negative for fever and chills.  Gastrointestinal: Positive for heartburn, nausea, vomiting and abdominal pain (epigastric pain only). Negative for diarrhea, constipation and blood in  stool.  Genitourinary:       Neg VB  Musculoskeletal: Negative for myalgias.  Neurological: Positive for weakness.  Psychiatric/Behavioral: Negative for depression. The patient is nervous/anxious.    Physical Exam   Blood pressure 130/83, pulse 98, temperature 98.6 F (37 C), temperature source Oral, resp. rate 28, height 5\' 2"  (1.575 m), weight 106 lb 9.6 oz (48.353 kg), last menstrual period 11/18/2014.  Physical Exam  Constitutional: She is oriented to person, place, and time. Vital signs are normal. She appears well-developed. She appears distressed.  Underweight-appearing. Severely distressed--unsure how much is physical and how much is emotional. Doubled  over, voluntary shaking (able to stop when talked to). Not answering questions initially, but verbal.  Eyes: Conjunctivae are normal.  Cardiovascular: Normal rate and regular rhythm.   Respiratory: Effort normal and breath sounds normal.  GI: Soft. Bowel sounds are normal. She exhibits no distension. There is no tenderness. There is no guarding.  Neurological: She is alert and oriented to person, place, and time.  Skin: Skin is warm and dry.  Psychiatric: Her mood appears anxious. Her affect is inappropriate.    MAU Course  Procedures CBC, CMET, UA, Amylase, Lipase, RUQ [place], phenergan, Protonix, Dilaudid. Pt responded well to relaxation techniques.   MDM Pt much calmer. After discussion w/ pt and pt's mother, anxiety is clearly a major component of her problem. Reassured that labs are stable, but WBC count still up. Per consult w/ Dr. Jolayne Panther she is stable for D/C and F/U w/ PCP, ED for fever, worsening. Sx .  Assessment and Plan   1. Nausea and vomiting of pregnancy, antepartum   2. Epigastric pain   3. Nausea & vomiting    D/C home in stable condition per consult w/ Dr. Jolayne Panther. Follow-up Information    Follow up with [facility]   Why:  As needed in emergencies (Fever, worsening pain)   Contact information:   [address] [place] [address]       Follow up with Primary care provider.   Why:  if no improvement in abdominal pain, nausea, vomiting, congestion.         Medication List    STOP taking these medications        famotidine 20 MG tablet  Commonly known as:  PEPCID     HYDROcodone-acetaminophen 5-325 MG per tablet  Commonly known as:  NORCO/VICODIN     RECLIPSEN 0.15-30 MG-MCG tablet  Generic drug:  desogestrel-ethinyl estradiol      TAKE these medications        escitalopram 10 MG tablet  Commonly known as:  LEXAPRO  Take 10 mg by mouth at bedtime.     LORazepam 0.5 MG tablet  Commonly known as:  ATIVAN  Take 1 mg by  mouth every 8 (eight) hours.     omeprazole 40 MG capsule  Commonly known as:  PRILOSEC  Take 40 mg by mouth daily.     ondansetron 8 MG disintegrating tablet  Commonly known as:  ZOFRAN-ODT  Take 1 tablet (8 mg total) by mouth every 8 (eight) hours as needed for nausea or vomiting.     oxyCODONE-acetaminophen 5-325 MG per tablet  Commonly known as:  PERCOCET/ROXICET  Take 1-2 tablets by mouth every 6 (six) hours as needed.     promethazine 25 MG suppository  Commonly known as:  PHENERGAN  Place 1 suppository (25 mg total) rectally every 6 (six) hours as needed for nausea or vomiting. Can be  used vaginally.        Dorathy KinsmanSMITH, Leianna Barga 01/13/2015, 2:35 PM

## 2015-01-13 NOTE — MAU Note (Signed)
Pt states she is scheduled for a TAB this afternoon but is here having pain.

## 2015-05-08 ENCOUNTER — Other Ambulatory Visit: Payer: Self-pay | Admitting: Sports Medicine

## 2015-05-08 DIAGNOSIS — M25562 Pain in left knee: Secondary | ICD-10-CM

## 2015-05-09 ENCOUNTER — Emergency Department (HOSPITAL_COMMUNITY)
Admission: EM | Admit: 2015-05-09 | Discharge: 2015-05-09 | Disposition: A | Discharge status: Home / Self Care | Payer: 59 | Attending: Emergency Medicine | Admitting: Emergency Medicine

## 2015-05-09 ENCOUNTER — Encounter (HOSPITAL_COMMUNITY): Payer: Self-pay | Admitting: *Deleted

## 2015-05-09 DIAGNOSIS — R1013 Epigastric pain: Secondary | ICD-10-CM | POA: Insufficient documentation

## 2015-05-09 DIAGNOSIS — F410 Panic disorder [episodic paroxysmal anxiety] without agoraphobia: Secondary | ICD-10-CM | POA: Insufficient documentation

## 2015-05-09 DIAGNOSIS — Z8719 Personal history of other diseases of the digestive system: Secondary | ICD-10-CM | POA: Insufficient documentation

## 2015-05-09 DIAGNOSIS — R1110 Vomiting, unspecified: Secondary | ICD-10-CM | POA: Diagnosis present

## 2015-05-09 DIAGNOSIS — R112 Nausea with vomiting, unspecified: Secondary | ICD-10-CM | POA: Diagnosis not present

## 2015-05-09 DIAGNOSIS — Z3202 Encounter for pregnancy test, result negative: Secondary | ICD-10-CM | POA: Insufficient documentation

## 2015-05-09 LAB — COMPREHENSIVE METABOLIC PANEL
ALBUMIN: 4.2 g/dL (ref 3.5–5.0)
ALK PHOS: 52 U/L (ref 38–126)
ALT: 20 U/L (ref 14–54)
ANION GAP: 9 (ref 5–15)
AST: 31 U/L (ref 15–41)
BUN: 7 mg/dL (ref 6–20)
CALCIUM: 9.9 mg/dL (ref 8.9–10.3)
CHLORIDE: 106 mmol/L (ref 101–111)
CO2: 23 mmol/L (ref 22–32)
Creatinine, Ser: 0.69 mg/dL (ref 0.44–1.00)
GFR calc non Af Amer: >60 mL/min (ref >60)
GLUCOSE: 152 mg/dL — AB (ref 65–99)
POTASSIUM: 4 mmol/L (ref 3.5–5.1)
Sodium: 138 mmol/L (ref 135–145)
Total Bilirubin: 1 mg/dL (ref 0.3–1.2)
Total Protein: 7 g/dL (ref 6.5–8.1)

## 2015-05-09 LAB — I-STAT BETA HCG BLOOD, ED (MC, WL, AP ONLY)

## 2015-05-09 LAB — CBC
HCT: 41.1 % (ref 36.0–46.0)
HEMOGLOBIN: 14.1 g/dL (ref 12.0–15.0)
MCH: 31.6 pg (ref 26.0–34.0)
MCHC: 34.3 g/dL (ref 30.0–36.0)
MCV: 92.2 fL (ref 78.0–100.0)
Platelets: 261 10*3/uL (ref 150–400)
RBC: 4.46 MIL/uL (ref 3.87–5.11)
RDW: 12.5 % (ref 11.5–15.5)
WBC: 13.3 10*3/uL — ABNORMAL HIGH (ref 4.0–10.5)

## 2015-05-09 LAB — LIPASE, BLOOD: Lipase: 20 U/L (ref 11–51)

## 2015-05-09 MED ORDER — FAMOTIDINE IN NACL 20-0.9 MG/50ML-% IV SOLN
20.0000 mg | Freq: Once | INTRAVENOUS | Status: AC
  Administered 2015-05-09: 20 mg via INTRAVENOUS
  Filled 2015-05-09: qty 50

## 2015-05-09 MED ORDER — RANITIDINE HCL 150 MG PO TABS: 150.0000 mg | ORAL_TABLET | Freq: Two times a day (BID) | ORAL | Status: DC | PRN

## 2015-05-09 MED ORDER — ONDANSETRON 4 MG PO TBDP
4.0000 mg | ORAL_TABLET | Freq: Once | ORAL | Status: AC | PRN
  Administered 2015-05-09: 4 mg via ORAL

## 2015-05-09 MED ORDER — METOCLOPRAMIDE HCL 5 MG/ML IJ SOLN
10.0000 mg | Freq: Once | INTRAMUSCULAR | Status: AC
  Administered 2015-05-09: 10 mg via INTRAVENOUS
  Filled 2015-05-09: qty 2

## 2015-05-09 MED ORDER — ONDANSETRON 4 MG PO TBDP
ORAL_TABLET | ORAL | Status: AC
  Filled 2015-05-09: qty 1

## 2015-05-09 MED ORDER — DIPHENHYDRAMINE HCL 50 MG/ML IJ SOLN
25.0000 mg | Freq: Once | INTRAMUSCULAR | Status: AC
  Administered 2015-05-09: 25 mg via INTRAVENOUS
  Filled 2015-05-09: qty 1

## 2015-05-09 MED ORDER — METOCLOPRAMIDE HCL 10 MG PO TABS: 10.0000 mg | ORAL_TABLET | Freq: Four times a day (QID) | ORAL | Status: DC

## 2015-05-09 NOTE — ED Provider Notes (Signed)
CSN: [id number]     Arrival date & time 05/09/15  1310 History   First MD Initiated Contact with Patient 05/09/15 1510     Chief Complaint  Patient presents with  . Emesis     (Consider location/radiation/quality/duration/timing/severity/associated sxs/prior Treatment) Patient is a 24 y.o. female presenting with vomiting. The history is provided by the patient and medical records.  Emesis Severity:  Severe Duration:  1 day Timing:  Constant Number of daily episodes:  20 Quality:  Stomach contents Progression:  Unchanged Chronicity:  Recurrent Recent urination:  Normal Relieved by:  Nothing Worsened by:  Nothing tried Ineffective treatments: Rx PO antiemetics. Associated symptoms: abdominal pain   Associated symptoms: no arthralgias, no chills, no cough, no diarrhea, no fever, no headaches, no myalgias, no sore throat and no URI   Risk factors: no diabetes, not pregnant now, no prior abdominal surgery and no sick contacts   Risk factors comment:  Hx of cyclical vomiting syndrome   Past Medical History  Diagnosis Date  . Generalized anxiety disorder   . Cyclic vomiting syndrome 01/05/2015    marijuana induced emesis  . Marijuana use     daily  . Panic attacks   . GERD (gastroesophageal reflux disease)    Past Surgical History  Procedure Laterality Date  . No past surgeries     History reviewed. No pertinent family history. Social History  Substance Use Topics  . Smoking status: Never Smoker   . Smokeless tobacco: None  . Alcohol Use: No   OB History    Gravida Para Term Preterm AB TAB SAB Ectopic Multiple Living   1              Review of Systems  Constitutional: Negative for fever and chills.  HENT: Negative for facial swelling and sore throat.   Respiratory: Negative for shortness of breath.   Cardiovascular: Negative for chest pain.  Gastrointestinal: Positive for nausea, vomiting and abdominal pain. Negative for diarrhea.  Genitourinary: Negative for  dysuria.  Musculoskeletal: Negative for myalgias, back pain and arthralgias.  Skin: Negative for rash.  Neurological: Negative for headaches.  Psychiatric/Behavioral: Negative for confusion.      Allergies  Review of patient's allergies indicates no known allergies.  Home Medications   Prior to Admission medications   Medication Sig Start Date End Date Taking? Authorizing Provider  escitalopram (LEXAPRO) 10 MG tablet Take 10 mg by mouth at bedtime. 12/20/14  Yes Historical Provider, MD  ondansetron (ZOFRAN-ODT) 8 MG disintegrating tablet Take 1 tablet (8 mg total) by mouth every 8 (eight) hours as needed for nausea or vomiting. 01/13/15   Dorathy Kinsman, CNM  oxyCODONE-acetaminophen (PERCOCET/ROXICET) 5-325 MG per tablet Take 1-2 tablets by mouth every 6 (six) hours as needed. 01/13/15   Dorathy Kinsman, CNM  promethazine (PHENERGAN) 25 MG suppository Place 1 suppository (25 mg total) rectally every 6 (six) hours as needed for nausea or vomiting. Can be used vaginally. 01/13/15   Dorathy Kinsman, CNM   BP 120/74 mmHg  Pulse 85  Temp(Src) 97.5 F (36.4 C) (Oral)  Resp 18  Ht  (1.6 m)  Wt 112 lb (50.803 kg)  BMI 19.84 kg/m2  SpO2 100%  LMP 04/18/2015 (Exact Date)  Breastfeeding? Unknown Physical Exam  Constitutional: She is oriented to person, place, and time. She appears well-developed and well-nourished. No distress.  HENT:  Head: Normocephalic and atraumatic.  Right Ear: External ear normal.  Left Ear: External ear normal.  Nose: Nose normal.  Mouth/Throat:  Oropharynx is clear and moist. No oropharyngeal exudate.  Eyes: Conjunctivae and EOM are normal. Pupils are equal, round, and reactive to light. Right eye exhibits no discharge. Left eye exhibits no discharge. No scleral icterus.  Neck: Normal range of motion. Neck supple. No JVD present. No tracheal deviation present. No thyromegaly present.  Cardiovascular: Normal rate, regular rhythm and intact distal pulses.    Pulmonary/Chest: Effort normal and breath sounds normal. No stridor. No respiratory distress. She has no wheezes. She has no rales. She exhibits no tenderness.  Abdominal: Soft. She exhibits no distension. There is tenderness in the epigastric area.  Mild epigastric TTP  Musculoskeletal: Normal range of motion. She exhibits no edema or tenderness.  Lymphadenopathy:    She has no cervical adenopathy.  Neurological: She is alert and oriented to person, place, and time.  Skin: Skin is warm and dry. No rash noted. She is not diaphoretic. No erythema. No pallor.  Psychiatric: She has a normal mood and affect. Her behavior is normal. Judgment and thought content normal.  Nursing note and vitals reviewed.   ED Course  Procedures (including critical care time) Labs Review Labs Reviewed  COMPREHENSIVE METABOLIC PANEL - Abnormal; Notable for the following:    Glucose, Bld 152 (*)    All other components within normal limits  CBC - Abnormal; Notable for the following:    WBC 13.3 (*)    All other components within normal limits  LIPASE, BLOOD  URINALYSIS, ROUTINE W REFLEX MICROSCOPIC (NOT AT [facility])  I-STAT BETA HCG BLOOD, ED ([facility], [facility], [facility] ONLY)    MDM   Final diagnoses:  Non-intractable vomiting with nausea, vomiting of unspecified type  Epigastric pain    Pt with hx of CVS 2/2 marijuana use.  Presenting with epigastric pain and emesis x 1 day.  Pt states symptoms were not relieved by PO meds at home.  IVF bolus, antiemetics, and H2 blocker given in ED.  Pt denies pelvic pain, dysuria, or flank pain.  No vaginal symptoms.  Pt denies recent marijuana use.  Symptoms resolved with medications.  No further emesis in ED.  Pt Rx for reglan and zantac, and advised to follow up w/ PCP.  Patient was given return precautions for emesis.  Pt advised on use of medications as applicable.  Advised to return for actely worsening symptoms, inability to take medications, or other acute concerns.  Advised to  follow up with PCP in 1 week.  Patient was in agreement with and expressed understanding of follow plan, plan of care, and return precautions.  All questions answered prior to discharge.  Patient was discharged in stable condition with family, ambulating without difficulty.  Patient care was discussed with my attending, Dr. Bebe ShaggyWickline.   Gavin PoundJustin Sally Reimers, MD 05/09/15 1750  Zadie Rhineonald Wickline, MD 05/09/15 2126

## 2015-05-09 NOTE — ED Notes (Signed)
Pt reports n/v since 0900. Denies diarrhea.

## 2015-05-09 NOTE — ED Notes (Signed)
MD at bedside. 

## 2015-05-09 NOTE — ED Notes (Signed)
Pt is in stable condition upon d/c and ambulates from ED. 

## 2015-05-09 NOTE — ED Provider Notes (Signed)
Patient seen/examined in the Emergency Department in conjunction with Resident Physician Provider  Patient reports nausea/vomiting.  She also reports epigastric pain Exam : awake/alert, mild tenderness to epigastric region.   Plan: rehydrate and reassess Labs unremarkable at this time    Zadie Rhineonald Rod Majerus, MD 05/09/15 1524

## 2015-05-09 NOTE — Discharge Instructions (Signed)

## 2015-05-20 ENCOUNTER — Ambulatory Visit
Admission: RE | Admit: 2015-05-20 | Discharge: 2015-05-20 | Disposition: A | Discharge status: Home / Self Care | Payer: 59 | Source: Ambulatory Visit | Attending: Sports Medicine | Admitting: Sports Medicine

## 2015-05-20 DIAGNOSIS — M25562 Pain in left knee: Secondary | ICD-10-CM

## 2015-05-21 ENCOUNTER — Encounter: Payer: Self-pay | Admitting: Podiatry

## 2015-06-23 ENCOUNTER — Other Ambulatory Visit: Payer: Self-pay | Admitting: Physician Assistant

## 2015-07-05 NOTE — L&D Delivery Note (Signed)
Delivery Note At  0156 a viable Female "Laura Frank" was delivered via  (LOA ).  APGAR: 8-9 , ; weight  .   Placenta status: did not come out intact; retrieved part of the placenta that remained in vaginal vault , Cord: 3 vessels with the following complications: none  Anesthesia:  Epidural Episiotomy:  none Lacerations:  none Suture Repair: na Est. Blood Loss (mL):  100  Mom to postpartum.  Baby to Couplet care / Skin to Skin. Breastfeeding May stay 48 hours [name] 06/24/2016, 2:24 AM

## 2015-11-17 LAB — OB RESULTS CONSOLE RPR: RPR: NONREACTIVE

## 2015-11-17 LAB — OB RESULTS CONSOLE HIV ANTIBODY (ROUTINE TESTING): HIV: NONREACTIVE

## 2015-11-17 LAB — OB RESULTS CONSOLE RUBELLA ANTIBODY, IGM: RUBELLA: IMMUNE

## 2015-11-17 LAB — OB RESULTS CONSOLE GC/CHLAMYDIA
CHLAMYDIA, DNA PROBE: NEGATIVE
GC PROBE AMP, GENITAL: NEGATIVE

## 2015-11-18 ENCOUNTER — Encounter (HOSPITAL_COMMUNITY): Payer: Self-pay | Admitting: *Deleted

## 2015-11-18 ENCOUNTER — Inpatient Hospital Stay (HOSPITAL_COMMUNITY)
Admission: AD | Admit: 2015-11-18 | Discharge: 2015-11-18 | Disposition: A | Discharge status: Home / Self Care | Payer: 59 | Source: Ambulatory Visit | Attending: Obstetrics & Gynecology | Admitting: Obstetrics & Gynecology

## 2015-11-18 DIAGNOSIS — O99611 Diseases of the digestive system complicating pregnancy, first trimester: Secondary | ICD-10-CM | POA: Diagnosis not present

## 2015-11-18 DIAGNOSIS — O210 Mild hyperemesis gravidarum: Secondary | ICD-10-CM | POA: Diagnosis present

## 2015-11-18 DIAGNOSIS — F1290 Cannabis use, unspecified, uncomplicated: Secondary | ICD-10-CM | POA: Diagnosis not present

## 2015-11-18 DIAGNOSIS — Z3A08 8 weeks gestation of pregnancy: Secondary | ICD-10-CM | POA: Insufficient documentation

## 2015-11-18 DIAGNOSIS — K219 Gastro-esophageal reflux disease without esophagitis: Secondary | ICD-10-CM | POA: Diagnosis not present

## 2015-11-18 DIAGNOSIS — O99321 Drug use complicating pregnancy, first trimester: Secondary | ICD-10-CM | POA: Insufficient documentation

## 2015-11-18 DIAGNOSIS — O219 Vomiting of pregnancy, unspecified: Secondary | ICD-10-CM

## 2015-11-18 LAB — URINALYSIS, ROUTINE W REFLEX MICROSCOPIC
Bilirubin Urine: NEGATIVE
Glucose, UA: NEGATIVE mg/dL
Hgb urine dipstick: NEGATIVE
Ketones, ur: NEGATIVE mg/dL
Leukocytes, UA: NEGATIVE
Nitrite: NEGATIVE
Protein, ur: 30 mg/dL — AB
Specific Gravity, Urine: 1.01 (ref 1.005–1.030)

## 2015-11-18 LAB — URINE MICROSCOPIC-ADD ON: BACTERIA UA: NONE SEEN

## 2015-11-18 LAB — POCT PREGNANCY, URINE: Preg Test, Ur: POSITIVE — AB

## 2015-11-18 MED ORDER — METOCLOPRAMIDE HCL 5 MG/ML IJ SOLN
10.0000 mg | Freq: Once | INTRAMUSCULAR | Status: AC
  Administered 2015-11-18: 10 mg via INTRAVENOUS
  Filled 2015-11-18: qty 2

## 2015-11-18 MED ORDER — LACTATED RINGERS IV SOLN
INTRAVENOUS | Status: DC
  Administered 2015-11-18: 10:00:00 via INTRAVENOUS

## 2015-11-18 MED ORDER — DOXYLAMINE-PYRIDOXINE 10-10 MG PO TBEC: DELAYED_RELEASE_TABLET | ORAL | Status: DC

## 2015-11-18 MED ORDER — FAMOTIDINE IN NACL 20-0.9 MG/50ML-% IV SOLN
20.0000 mg | Freq: Once | INTRAVENOUS | Status: AC
  Administered 2015-11-18: 20 mg via INTRAVENOUS
  Filled 2015-11-18: qty 50

## 2015-11-18 MED ORDER — ONDANSETRON HCL 4 MG/2ML IJ SOLN
4.0000 mg | Freq: Once | INTRAMUSCULAR | Status: AC
  Administered 2015-11-18: 4 mg via INTRAVENOUS
  Filled 2015-11-18: qty 2

## 2015-11-18 NOTE — MAU Provider Note (Signed)
  History   25  y/o G2P0010 @ 8W 5 D EGA presenting with nausea and vomiting of pregnancy that has not improved despite oral zofran and reglan use.  Also with mid epigastric /lower abdominal pain.  She denies lower abdominal cramping, or vaginal bleeding. She had a recent ultrasound in [place] that [facility]    Patient Active Problem List   Diagnosis Date Noted  . Intractable vomiting 01/03/2015    Chief Complaint  Patient presents with  . Emesis During Pregnancy  . Abdominal Pain   HPI  OB History    Gravida Para Term Preterm AB TAB SAB Ectopic Multiple Living   2    1 1     0      Past Medical History  Diagnosis Date  . Generalized anxiety disorder   . Cyclic vomiting syndrome 01/05/2015    marijuana induced emesis  . Marijuana use     daily  . Panic attacks   . GERD (gastroesophageal reflux disease)     Past Surgical History  Procedure Laterality Date  . No past surgeries      History reviewed. No pertinent family history.  Social History  Substance Use Topics  . Smoking status: Never Smoker   . Smokeless tobacco: None  . Alcohol Use: No    Allergies: No Known Allergies  No prescriptions prior to admission    ROS  Constitutional: Denies fevers/chills Cardiovascular: Denies chest pain or palpitations Pulmonary: Denies coughing or wheezing Gastrointestinal: Positive nausea, vomiting.  Denies diarrhea. With epigastric/lower abdominal pain.  Genitourinary: Denies pelvic pain, unusual vaginal bleeding, unusual vaginal discharge, dysuria, urgency or frequency.  Musculoskeletal: Denies muscle or joint aches and pain.  Neurology: Denies abnormal sensations such as tingling or numbness.   Physical Exam   Blood pressure 113/80, pulse 68, temperature 97.9 F (36.6 C), temperature source Oral, resp. rate 18, height 5\' 3"  (1.6 m), weight 52.164 kg (115 lb), last menstrual period 11/18/2014, unknown if currently  breastfeeding.    Physical Exam  Filed Vitals:   11/18/15 0852 11/18/15 0856 11/18/15 1251  BP:  128/82 113/80  Pulse:  80 68  Temp:  97.9 F (36.6 C)   TempSrc:  Oral   Resp:  18   Height: 5\' 3"  (1.6 m)    Weight: 52.164 kg (115 lb)     Gen: NAD Abdomen: soft, non tender, non distended.   ED Course Patient received IV hydration and IV zofran, reglan and pepcid and felt better after treatment, was able to tolerate clears and crackers   Assessment:  Nausea and vomiting of pregnancy, GERD, improved  Plan:  Discharge to home -Diclegis prescription sent, may use it in addition to reglan, zofran, phenergan that she already has -Discussed prevention  and management of nausea and vomiting of pregnancy and also precautions   -Follow up in office as scheduled.     Konrad FelixKULWA,Elois Averitt WAKURU MD.  11/18/2015 12:59 PM

## 2015-11-18 NOTE — MAU Note (Signed)
Hospitalized in [place] 5/11 for hyperemesis. Was given reglan and zofran for nausea. States zofran was working till today.

## 2015-11-25 ENCOUNTER — Encounter (HOSPITAL_COMMUNITY): Payer: Self-pay | Admitting: *Deleted

## 2015-11-25 ENCOUNTER — Inpatient Hospital Stay (HOSPITAL_COMMUNITY)
Admission: AD | Admit: 2015-11-25 | Discharge: 2015-11-25 | Disposition: A | Discharge status: Home / Self Care | Payer: 59 | Source: Ambulatory Visit | Attending: Obstetrics and Gynecology | Admitting: Obstetrics and Gynecology

## 2015-11-25 DIAGNOSIS — K219 Gastro-esophageal reflux disease without esophagitis: Secondary | ICD-10-CM | POA: Insufficient documentation

## 2015-11-25 DIAGNOSIS — Z3A09 9 weeks gestation of pregnancy: Secondary | ICD-10-CM | POA: Insufficient documentation

## 2015-11-25 DIAGNOSIS — K5900 Constipation, unspecified: Secondary | ICD-10-CM | POA: Diagnosis present

## 2015-11-25 DIAGNOSIS — O99611 Diseases of the digestive system complicating pregnancy, first trimester: Secondary | ICD-10-CM | POA: Insufficient documentation

## 2015-11-25 MED ORDER — FLEET ENEMA 7-19 GM/118ML RE ENEM
1.0000 | ENEMA | Freq: Once | RECTAL | Status: AC
Start: 2015-11-25 — End: 2015-11-25
  Administered 2015-11-25: 1 via RECTAL

## 2015-11-25 NOTE — MAU Note (Signed)
Pt presents to MAU with complaints of pain around her belly button. States she has not been able to have a bowel movement in 3 days. Pt states she has taken miralax and MOM

## 2015-11-25 NOTE — MAU Note (Signed)
Urine sent to lab 

## 2015-11-25 NOTE — Discharge Instructions (Signed)

## 2015-11-25 NOTE — Progress Notes (Signed)
Dr Roberts notified of pt's complaints, orders received 

## 2015-11-25 NOTE — MAU Provider Note (Addendum)
  History     CSN: [id number]  Arrival date and time: 11/25/15 1714   None     Chief Complaint  Patient presents with  . Constipation   HPI Pt presents c/o abdominal pain at her belly button that was relieved after presenting and attempt made to hear FHTs.  Pt has not had a bowel movement and is basically here wanting to have a BM.  She has n/v in pregnancy that sounds wnl.  Denies cramping or spotting.  OB History    Gravida Para Term Preterm AB TAB SAB Ectopic Multiple Living   2    1 1     0      Past Medical History  Diagnosis Date  . Generalized anxiety disorder   . Cyclic vomiting syndrome 01/05/2015    marijuana induced emesis  . Marijuana use     daily  . Panic attacks   . GERD (gastroesophageal reflux disease)     Past Surgical History  Procedure Laterality Date  . No past surgeries      History reviewed. No pertinent family history.  Social History  Substance Use Topics  . Smoking status: Never Smoker   . Smokeless tobacco: None  . Alcohol Use: No    Allergies: No Known Allergies  Prescriptions prior to admission  Medication Sig Dispense Refill Last Dose  . acetaminophen (TYLENOL) 325 MG tablet Take 650 mg by mouth every 6 (six) hours as needed for mild pain.   11/25/2015 at Unknown time  . diphenhydrAMINE (BENADRYL) 25 MG tablet Take 50 mg by mouth every 6 (six) hours as needed for sleep.   11/25/2015 at Unknown time  . Doxylamine-Pyridoxine 10-10 MG TBEC 2 tablets at bedtime daily.  Also may additionally use 1 tab in the morning before breakfast and 1 tab in the afternoon before lunch. 60 tablet 1 11/25/2015 at Unknown time  . famotidine (PEPCID) 20 MG tablet Take 20 mg by mouth daily as needed for heartburn or indigestion.   11/25/2015 at Unknown time  . metoCLOPramide (REGLAN) 10 MG tablet Take 10 mg by mouth every 6 (six) hours as needed for nausea.   11/24/2015 at Unknown time  . omeprazole (PRILOSEC) 40 MG capsule Take 40 mg by mouth 2 (two) times  daily.   11/25/2015 at Unknown time  . ondansetron (ZOFRAN-ODT) 8 MG disintegrating tablet Take 8 mg by mouth 2 (two) times daily as needed for nausea or vomiting.   1 11/25/2015 at Unknown time  . Prenatal Vit-Fe Fumarate-FA (PRENATAL MULTIVITAMIN) TABS tablet Take 1 tablet by mouth daily at 12 noon.   11/25/2015 at Unknown time    ROS  Non-contributory  Physical Exam   Blood pressure 128/60, pulse 69, temperature 98.3 F (36.8 C), resp. rate 18, last menstrual period 11/18/2014, unknown if currently breastfeeding.  Physical Exam Lungs CTA CV RRR Abd soft NT Ext no calf tenderness  MAU Course  Procedures  Enema FHTs 164  Assessment and Plan  G2P0 at 9 6/7wks with constipation.  Pt s/p enema and BM and feels better.  Instructions given to help with constipation and prevention.  Pt asked several questions.  Keep routine f/u ob appt.  Call with any problems or concerns.  Pt does not feel nausea and declined nausea meds.  I also discussed her anxiety and options.  She will cont to observe for now.  Laura Frank NailsROBERTS,Laura Frank 11/25/2015, 6:47 PM

## 2015-12-03 ENCOUNTER — Inpatient Hospital Stay (HOSPITAL_COMMUNITY)
Admission: AD | Admit: 2015-12-03 | Discharge: 2015-12-03 | Disposition: A | Discharge status: Home / Self Care | Payer: 59 | Source: Ambulatory Visit | Attending: Obstetrics and Gynecology | Admitting: Obstetrics and Gynecology

## 2015-12-03 ENCOUNTER — Encounter (HOSPITAL_COMMUNITY): Payer: Self-pay

## 2015-12-03 DIAGNOSIS — K219 Gastro-esophageal reflux disease without esophagitis: Secondary | ICD-10-CM

## 2015-12-03 DIAGNOSIS — R112 Nausea with vomiting, unspecified: Secondary | ICD-10-CM | POA: Diagnosis present

## 2015-12-03 DIAGNOSIS — O210 Mild hyperemesis gravidarum: Secondary | ICD-10-CM

## 2015-12-03 DIAGNOSIS — E860 Dehydration: Secondary | ICD-10-CM | POA: Insufficient documentation

## 2015-12-03 DIAGNOSIS — O26891 Other specified pregnancy related conditions, first trimester: Secondary | ICD-10-CM | POA: Diagnosis not present

## 2015-12-03 DIAGNOSIS — Z3A11 11 weeks gestation of pregnancy: Secondary | ICD-10-CM | POA: Diagnosis not present

## 2015-12-03 DIAGNOSIS — O99281 Endocrine, nutritional and metabolic diseases complicating pregnancy, first trimester: Secondary | ICD-10-CM | POA: Diagnosis not present

## 2015-12-03 LAB — URINALYSIS, ROUTINE W REFLEX MICROSCOPIC
Bilirubin Urine: NEGATIVE
GLUCOSE, UA: NEGATIVE mg/dL
Hgb urine dipstick: NEGATIVE
Ketones, ur: 15 mg/dL — AB
NITRITE: NEGATIVE
PH: 7 (ref 5.0–8.0)
Protein, ur: NEGATIVE mg/dL
SPECIFIC GRAVITY, URINE: 1.01 (ref 1.005–1.030)

## 2015-12-03 LAB — URINE MICROSCOPIC-ADD ON: RBC / HPF: NONE SEEN RBC/hpf (ref 0–5)

## 2015-12-03 MED ORDER — LACTATED RINGERS IV BOLUS (SEPSIS)
1000.0000 mL | Freq: Once | INTRAVENOUS | Status: AC
  Administered 2015-12-03: 1000 mL via INTRAVENOUS

## 2015-12-03 MED ORDER — FAMOTIDINE IN NACL 20-0.9 MG/50ML-% IV SOLN
20.0000 mg | Freq: Once | INTRAVENOUS | Status: AC
  Administered 2015-12-03: 20 mg via INTRAVENOUS
  Filled 2015-12-03: qty 50

## 2015-12-03 MED ORDER — ONDANSETRON HCL 40 MG/20ML IJ SOLN
8.0000 mg | Freq: Once | INTRAMUSCULAR | Status: AC
  Administered 2015-12-03: 8 mg via INTRAVENOUS
  Filled 2015-12-03: qty 4

## 2015-12-03 MED ORDER — PANTOPRAZOLE SODIUM 40 MG IV SOLR
20.0000 mg | Freq: Once | INTRAVENOUS | Status: AC
  Administered 2015-12-03: 20 mg via INTRAVENOUS
  Filled 2015-12-03: qty 20

## 2015-12-03 MED ORDER — PANTOPRAZOLE SODIUM 20 MG PO TBEC: 20.0000 mg | DELAYED_RELEASE_TABLET | Freq: Every day | ORAL | Status: DC

## 2015-12-03 NOTE — MAU Provider Note (Signed)
Swarigen, Dahlia ClientHannah is a 25 yo, G2P0010 at 11.0 bpm presenting to MAU unannounced for Nausea and vomiting. She states she has vomited hourly since 8pm past night despite taking reglan, zofran, and diclegis.  Pt reports last eating a lunch yesterday, diet recall from yesterday includes -  Pancakes, sausage and [employer] for lunch.   Denies abdominal pain, vaginal bleeding, urination, or   Vaginal discharge.  Last bowel movement was this morning.      When IV hydration and IV zofran was offered, pt requested Pepcid IV.      History     Patient Active Problem List   Diagnosis Date Noted  . Intractable vomiting 01/03/2015    Chief Complaint  Patient presents with  . Morning Sickness   HPI  OB History    Gravida Para Term Preterm AB TAB SAB Ectopic Multiple Living   2    1 1     0      Past Medical History  Diagnosis Date  . Generalized anxiety disorder   . Cyclic vomiting syndrome 01/05/2015    marijuana induced emesis  . Marijuana use     daily  . Panic attacks   . GERD (gastroesophageal reflux disease)     Past Surgical History  Procedure Laterality Date  . No past surgeries      No family history on file.  Social History  Substance Use Topics  . Smoking status: Never Smoker   . Smokeless tobacco: None  . Alcohol Use: No    Allergies: No Known Allergies  Prescriptions prior to admission  Medication Sig Dispense Refill Last Dose  . famotidine (PEPCID) 20 MG tablet Take 20 mg by mouth daily as needed for heartburn or indigestion.   12/03/2015 at 0130  . metoCLOPramide (REGLAN) 10 MG tablet Take 10 mg by mouth every 6 (six) hours as needed for nausea.   12/03/2015 at 0130  . ondansetron (ZOFRAN-ODT) 8 MG disintegrating tablet Take 8 mg by mouth 2 (two) times daily as needed for nausea or vomiting.   1 12/02/2015 at 2000  . Prenatal Vit-Fe Fumarate-FA (PRENATAL MULTIVITAMIN) TABS tablet Take 1 tablet by mouth daily at 12 noon.   12/02/2015 at Unknown time  . acetaminophen  (TYLENOL) 325 MG tablet Take 650 mg by mouth every 6 (six) hours as needed for mild pain.   11/25/2015 at Unknown time  . diphenhydrAMINE (BENADRYL) 25 MG tablet Take 50 mg by mouth every 6 (six) hours as needed for sleep.   11/25/2015 at Unknown time  . Doxylamine-Pyridoxine 10-10 MG TBEC 2 tablets at bedtime daily.  Also may additionally use 1 tab in the morning before breakfast and 1 tab in the afternoon before lunch. 60 tablet 1 11/25/2015 at Unknown time  . omeprazole (PRILOSEC) 40 MG capsule Take 40 mg by mouth 2 (two) times daily.   11/25/2015 at Unknown time    ROS Physical Exam   Blood pressure 122/74, pulse 76, temperature 98.3 F (36.8 C), temperature source Oral, resp. rate 18, last menstrual period 11/18/2014, unknown if currently breastfeeding.  Results for orders placed or performed during the hospital encounter of 12/03/15 (from the past 24 hour(s))  Urinalysis, Routine w reflex microscopic ([facility])     Status: Abnormal   Collection Time: 12/03/15  6:10 AM  Result Value Ref Range   Color, Urine YELLOW YELLOW   APPearance CLEAR CLEAR   Specific Gravity, Urine 1.010 1.005 - 1.030   pH 7.0 5.0 - 8.0  Glucose, UA NEGATIVE NEGATIVE mg/dL   Hgb urine dipstick NEGATIVE NEGATIVE   Bilirubin Urine NEGATIVE NEGATIVE   Ketones, ur 15 (A) NEGATIVE mg/dL   Protein, ur NEGATIVE NEGATIVE mg/dL   Nitrite NEGATIVE NEGATIVE   Leukocytes, UA SMALL (A) NEGATIVE  Urine microscopic-add on     Status: Abnormal   Collection Time: 12/03/15  6:10 AM  Result Value Ref Range   Squamous Epithelial / LPF 0-5 (A) NONE SEEN   WBC, UA 0-5 0 - 5 WBC/hpf   RBC / HPF NONE SEEN 0 - 5 RBC/hpf   Bacteria, UA RARE (A) NONE SEEN    FHT:  175 bpm   Physical Exam  Constitutional: She is oriented to person, place, and time. She appears well-developed and well-nourished. She appears distressed.  Eyes: Pupils are equal, round, and reactive to light.  Neck: Normal range of motion.  Respiratory: Effort  normal.  GI: Soft.  Musculoskeletal: Normal range of motion.  Neurological: She is alert and oriented to person, place, and time.  Skin: Skin is warm and dry.  Psychiatric: She has a normal mood and affect.    ED Course  Assessment: IUP at 11.0 wks N&V Dehydration, 15+ ketones  Plan: IV hydration Zofran  IV IV Pepcid, 20 mg   PO challenge Report given to oncoming provider, Carmela Hurt, MD   Alphonzo Severance CNM, MSN 12/03/2015 6:40 AM  I took over care of patient after sign out.  She received IV protonix for GERD symptoms and was then able to tolerate clears and crackers.  She was deemed stable for discharge and advised to follow up in office.  Patient already with pepcid and zofran medication at home, will give protonix prescription.   Dr. Sallye Ober.   12/03/2015 10:14 AM

## 2015-12-03 NOTE — MAU Note (Signed)
Pt presents complaining of nausea and vomiting since last night at 2000. States she has thrown up every hour since then. Has tried reglan, zofran and diclegis with no relief. Denies abdominal pain or vaginal bleeding.

## 2015-12-03 NOTE — Discharge Instructions (Signed)
Eating Plan for Hyperemesis Gravidarum °Severe cases of hyperemesis gravidarum can lead to dehydration and malnutrition. The hyperemesis eating plan is one way to lessen the symptoms of nausea and vomiting. It is often used with prescribed medicines to control your symptoms.  °WHAT CAN I DO TO RELIEVE MY SYMPTOMS? °Listen to your body. Everyone is different and has different preferences. Find what works best for you. Some of the following things may help: °· Eat and drink slowly. °· Eat 5-6 small meals daily instead of 3 large meals.   °· Eat crackers before you get out of bed in the morning.   °· Starchy foods are usually well tolerated (such as cereal, toast, bread, potatoes, pasta, rice, and pretzels).   °· Ginger may help with nausea. Add ¼ tsp ground ginger to hot tea or choose ginger tea.   °· Try drinking 100% fruit juice or an electrolyte drink. °· Continue to take your prenatal vitamins as directed by your health care provider. If you are having trouble taking your prenatal vitamins, talk with your health care provider about different options. °· Include at least 1 serving of protein with your meals and snacks (such as meats or poultry, beans, nuts, eggs, or yogurt). Try eating a protein-rich snack before bed (such as cheese and crackers or a half turkey or peanut butter sandwich). °WHAT THINGS SHOULD I AVOID TO REDUCE MY SYMPTOMS? °The following things may help reduce your symptoms: °· Avoid foods with strong smells. Try eating meals in well-ventilated areas that are free of odors. °· Avoid drinking water or other beverages with meals. Try not to drink anything less than 30 minutes before and after meals. °· Avoid drinking more than 1 cup of fluid at a time. °· Avoid fried or high-fat foods, such as butter and cream sauces. °· Avoid spicy foods. °· Avoid skipping meals the best you can. Nausea can be more intense on an empty stomach. If you cannot tolerate food at that time, do not force it. Try sucking on  ice chips or other frozen items and make up the calories later. °· Avoid lying down within 2 hours after eating. °  °This information is not intended to replace advice given to you by your health care provider. Make sure you discuss any questions you have with your health care provider. °  °Document Released: 04/17/2007 Document Revised: 06/25/2013 Document Reviewed: 04/24/2013 °Elsevier Interactive Patient Education ©2016 [employer] ° °

## 2015-12-21 ENCOUNTER — Inpatient Hospital Stay (HOSPITAL_COMMUNITY)
Admission: AD | Admit: 2015-12-21 | Discharge: 2015-12-21 | Disposition: A | Discharge status: Home / Self Care | Payer: Medicaid Other | Source: Ambulatory Visit | Attending: Obstetrics & Gynecology | Admitting: Obstetrics & Gynecology

## 2015-12-21 ENCOUNTER — Encounter (HOSPITAL_COMMUNITY): Payer: Self-pay | Admitting: *Deleted

## 2015-12-21 DIAGNOSIS — F411 Generalized anxiety disorder: Secondary | ICD-10-CM | POA: Diagnosis not present

## 2015-12-21 DIAGNOSIS — O99321 Drug use complicating pregnancy, first trimester: Secondary | ICD-10-CM | POA: Diagnosis not present

## 2015-12-21 DIAGNOSIS — O210 Mild hyperemesis gravidarum: Secondary | ICD-10-CM | POA: Diagnosis present

## 2015-12-21 DIAGNOSIS — Z3A13 13 weeks gestation of pregnancy: Secondary | ICD-10-CM | POA: Diagnosis not present

## 2015-12-21 DIAGNOSIS — O219 Vomiting of pregnancy, unspecified: Secondary | ICD-10-CM

## 2015-12-21 DIAGNOSIS — O99611 Diseases of the digestive system complicating pregnancy, first trimester: Secondary | ICD-10-CM | POA: Insufficient documentation

## 2015-12-21 DIAGNOSIS — O99341 Other mental disorders complicating pregnancy, first trimester: Secondary | ICD-10-CM | POA: Diagnosis not present

## 2015-12-21 DIAGNOSIS — K219 Gastro-esophageal reflux disease without esophagitis: Secondary | ICD-10-CM | POA: Diagnosis not present

## 2015-12-21 DIAGNOSIS — F1290 Cannabis use, unspecified, uncomplicated: Secondary | ICD-10-CM | POA: Insufficient documentation

## 2015-12-21 LAB — URINALYSIS, ROUTINE W REFLEX MICROSCOPIC
Bilirubin Urine: NEGATIVE
GLUCOSE, UA: NEGATIVE mg/dL
Hgb urine dipstick: NEGATIVE
Ketones, ur: >80 mg/dL — AB
LEUKOCYTES UA: NEGATIVE
Nitrite: NEGATIVE
PROTEIN: 30 mg/dL — AB
Specific Gravity, Urine: 1.02 (ref 1.005–1.030)
pH: 7 (ref 5.0–8.0)

## 2015-12-21 LAB — COMPREHENSIVE METABOLIC PANEL
ALBUMIN: 4.5 g/dL (ref 3.5–5.0)
ALK PHOS: 58 U/L (ref 38–126)
ALT: 18 U/L (ref 14–54)
AST: 29 U/L (ref 15–41)
Anion gap: 12 (ref 5–15)
BILIRUBIN TOTAL: 1 mg/dL (ref 0.3–1.2)
BUN: 7 mg/dL (ref 6–20)
CALCIUM: 10.4 mg/dL — AB (ref 8.9–10.3)
CO2: 21 mmol/L — AB (ref 22–32)
CREATININE: 0.46 mg/dL (ref 0.44–1.00)
Chloride: 102 mmol/L (ref 101–111)
GFR calc Af Amer: >60 mL/min (ref >60)
GFR calc non Af Amer: >60 mL/min (ref >60)
GLUCOSE: 131 mg/dL — AB (ref 65–99)
Potassium: 3.4 mmol/L — ABNORMAL LOW (ref 3.5–5.1)
SODIUM: 135 mmol/L (ref 135–145)
TOTAL PROTEIN: 7.6 g/dL (ref 6.5–8.1)

## 2015-12-21 LAB — URINE MICROSCOPIC-ADD ON: RBC / HPF: NONE SEEN RBC/hpf (ref 0–5)

## 2015-12-21 LAB — CBC
HEMATOCRIT: 38.6 % (ref 36.0–46.0)
Hemoglobin: 14.1 g/dL (ref 12.0–15.0)
MCH: 32.3 pg (ref 26.0–34.0)
MCHC: 36.5 g/dL — ABNORMAL HIGH (ref 30.0–36.0)
MCV: 88.3 fL (ref 78.0–100.0)
Platelets: 271 10*3/uL (ref 150–400)
RBC: 4.37 MIL/uL (ref 3.87–5.11)
RDW: 13 % (ref 11.5–15.5)
WBC: 16.1 10*3/uL — ABNORMAL HIGH (ref 4.0–10.5)

## 2015-12-21 MED ORDER — LACTATED RINGERS IV BOLUS (SEPSIS)
1000.0000 mL | Freq: Once | INTRAVENOUS | Status: AC
  Administered 2015-12-21: 1000 mL via INTRAVENOUS

## 2015-12-21 MED ORDER — FAMOTIDINE IN NACL 20-0.9 MG/50ML-% IV SOLN
20.0000 mg | Freq: Once | INTRAVENOUS | Status: AC
  Administered 2015-12-21: 20 mg via INTRAVENOUS
  Filled 2015-12-21: qty 50

## 2015-12-21 MED ORDER — SODIUM CHLORIDE 0.9 % IV SOLN
8.0000 mg | Freq: Once | INTRAVENOUS | Status: AC
  Administered 2015-12-21: 8 mg via INTRAVENOUS
  Filled 2015-12-21: qty 4

## 2015-12-21 MED ORDER — PANTOPRAZOLE SODIUM 40 MG PO TBEC
40.0000 mg | DELAYED_RELEASE_TABLET | Freq: Every day | ORAL | Status: DC
  Administered 2015-12-21: 40 mg via ORAL
  Filled 2015-12-21: qty 1

## 2015-12-21 MED ORDER — METOCLOPRAMIDE HCL 5 MG/ML IJ SOLN
10.0000 mg | Freq: Once | INTRAMUSCULAR | Status: AC
  Administered 2015-12-21: 10 mg via INTRAVENOUS
  Filled 2015-12-21: qty 2

## 2015-12-21 MED ORDER — PANTOPRAZOLE SODIUM 40 MG PO TBEC: 40.0000 mg | DELAYED_RELEASE_TABLET | Freq: Every day | ORAL | Status: DC

## 2015-12-21 MED ORDER — PROMETHAZINE HCL 25 MG/ML IJ SOLN
25.0000 mg | Freq: Once | INTRAMUSCULAR | Status: AC
  Administered 2015-12-21: 25 mg via INTRAVENOUS
  Filled 2015-12-21: qty 1

## 2015-12-21 MED ORDER — DEXTROSE IN LACTATED RINGERS 5 % IV SOLN
INTRAVENOUS | Status: DC
  Administered 2015-12-21: 09:00:00 via INTRAVENOUS

## 2015-12-21 NOTE — MAU Provider Note (Signed)
History   [id number]   Chief Complaint  Patient presents with  . Emesis During Pregnancy    HPI Laura Frank is a 25 y.o. female  G2P0010 at [redacted]w[redacted]d IUP here with emesis in pregnancy.  Pt reports increased vomiting for past 24 hours not responding to PO meds.  Prescribed Diclegis, Reglan, Pepcid, Zofran and Protonix.  Denies fever, body aches, or chills.  No recent exposure to ill individuals.    Patient's last menstrual period was 11/18/2014.  OB History  Gravida Para Term Preterm AB SAB TAB Ectopic Multiple Living  2    1  1    0    # Outcome Date GA Lbr Len/2nd Weight Sex Delivery Anes PTL Lv  2 Current           1 TAB               Past Medical History  Diagnosis Date  . Generalized anxiety disorder   . Cyclic vomiting syndrome 01/05/2015    marijuana induced emesis  . Marijuana use     daily  . Panic attacks   . GERD (gastroesophageal reflux disease)     Family History  Problem Relation Age of Onset  . Alcohol abuse Neg Hx   . Arthritis Neg Hx   . Asthma Neg Hx   . Birth defects Neg Hx   . Cancer Neg Hx   . COPD Neg Hx   . Depression Neg Hx   . Diabetes Neg Hx   . Drug abuse Neg Hx   . Early death Neg Hx   . Hearing loss Neg Hx   . Heart disease Neg Hx   . Hyperlipidemia Neg Hx   . Hypertension Neg Hx   . Kidney disease Neg Hx   . Learning disabilities Neg Hx   . Mental illness Neg Hx   . Mental retardation Neg Hx   . Miscarriages / Stillbirths Neg Hx   . Stroke Neg Hx   . Vision loss Neg Hx   . Varicose Veins Neg Hx     Social History   Social History  . Marital Status: Single    Spouse Name: N/A  . Number of Children: N/A  . Years of Education: N/A   Social History Main Topics  . Smoking status: Never Smoker   . Smokeless tobacco: None  . Alcohol Use: No  . Drug Use: Yes    Special: Marijuana     Comment: not currently  . Sexual Activity: Yes    Birth Control/ Protection: Pill     Comment: Last use 01/09/15   Other Topics Concern  .  None   Social History Narrative    No Known Allergies  No current facility-administered medications on file prior to encounter.   Current Outpatient Prescriptions on File Prior to Encounter  Medication Sig Dispense Refill  . diphenhydrAMINE (BENADRYL) 25 MG tablet Take 50 mg by mouth every 6 (six) hours as needed for sleep.    Marland Kitchen Doxylamine-Pyridoxine 10-10 MG TBEC 2 tablets at bedtime daily.  Also may additionally use 1 tab in the morning before breakfast and 1 tab in the afternoon before lunch. 60 tablet 1  . famotidine (PEPCID) 20 MG tablet Take 20 mg by mouth daily as needed for heartburn or indigestion.    . metoCLOPramide (REGLAN) 10 MG tablet Take 10 mg by mouth every 6 (six) hours as needed for nausea.    . ondansetron (ZOFRAN-ODT) 8 MG disintegrating tablet Take 8  mg by mouth 2 (two) times daily as needed for nausea or vomiting.   1  . pantoprazole (PROTONIX) 20 MG tablet Take 1 tablet (20 mg total) by mouth daily. STOP OMEPRAZOLE (PRILOSEC) WHEN USINGTHIS MEDICATION 14 tablet 0  . Prenatal Vit-Fe Fumarate-FA (PRENATAL MULTIVITAMIN) TABS tablet Take 1 tablet by mouth daily at 12 noon.    Marland Kitchen acetaminophen (TYLENOL) 325 MG tablet Take 650 mg by mouth every 6 (six) hours as needed for mild pain.       Review of Systems  Constitutional: Negative for fever and chills.  Gastrointestinal: Positive for nausea, vomiting and abdominal pain (mid pain with vomiting only). Negative for diarrhea.  Genitourinary: Negative for pelvic pain.  Neurological: Positive for dizziness.  All other systems reviewed and are negative.    Physical Exam   Filed Vitals:   12/21/15 0639  BP: 127/67  Pulse: 70  Temp: 97.6 F (36.4 C)  TempSrc: Oral  Resp: 18  Height:  (1.6 m)  Weight: 110 lb (49.896 kg)  SpO2: 100%    Physical Exam  Constitutional: She is oriented to person, place, and time. She appears well-developed and well-nourished.  HENT:  Head: Normocephalic.  Mouth/Throat: Mucous  membranes are dry.  Neck: Normal range of motion. Neck supple.  Cardiovascular: Normal rate, regular rhythm and normal heart sounds.   Respiratory: Effort normal and breath sounds normal.  GI: There is no tenderness.  Genitourinary: No bleeding in the vagina. No vaginal discharge found.  Neurological: She is alert and oriented to person, place, and time. She has normal reflexes.  Skin: Skin is warm and dry. She is not diaphoretic.    MAU Course  Procedures  MDM Results for orders placed or performed during the hospital encounter of 12/21/15 (from the past 24 hour(s))  Urinalysis, Routine w reflex microscopic (not at [facility])     Status: Abnormal   Collection Time: 12/21/15  6:43 AM  Result Value Ref Range   Color, Urine YELLOW YELLOW   APPearance HAZY (A) CLEAR   Specific Gravity, Urine 1.020 1.005 - 1.030   pH 7.0 5.0 - 8.0   Glucose, UA NEGATIVE NEGATIVE mg/dL   Hgb urine dipstick NEGATIVE NEGATIVE   Bilirubin Urine NEGATIVE NEGATIVE   Ketones, ur >80 (A) NEGATIVE mg/dL   Protein, ur 30 (A) NEGATIVE mg/dL   Nitrite NEGATIVE NEGATIVE   Leukocytes, UA NEGATIVE NEGATIVE  Urine microscopic-add on     Status: Abnormal   Collection Time: 12/21/15  6:43 AM  Result Value Ref Range   Squamous Epithelial / LPF 0-5 (A) NONE SEEN   WBC, UA 0-5 0 - 5 WBC/hpf   RBC / HPF NONE SEEN 0 - 5 RBC/hpf   Bacteria, UA FEW (A) NONE SEEN   Urine-Other MUCOUS PRESENT   CBC     Status: Abnormal   Collection Time: 12/21/15  8:30 AM  Result Value Ref Range   WBC 16.1 (H) 4.0 - 10.5 K/uL   RBC 4.37 3.87 - 5.11 MIL/uL   Hemoglobin 14.1 12.0 - 15.0 g/dL   HCT 16.1 09.6 - 04.5 %   MCV 88.3 78.0 - 100.0 fL   MCH 32.3 26.0 - 34.0 pg   MCHC 36.5 (H) 30.0 - 36.0 g/dL   RDW 40.9 81.1 - 91.4 %   Platelets 271 150 - 400 K/uL  Comprehensive metabolic panel     Status: Abnormal   Collection Time: 12/21/15  8:30 AM  Result Value Ref Range   Sodium  135 135 - 145 mmol/L   Potassium 3.4 (L) 3.5 - 5.1 mmol/L    Chloride 102 101 - 111 mmol/L   CO2 21 (L) 22 - 32 mmol/L   Glucose, Bld 131 (H) 65 - 99 mg/dL   BUN 7 6 - 20 mg/dL   Creatinine, Ser 1.610.46 0.44 - 1.00 mg/dL   Calcium 09.610.4 (H) 8.9 - 10.3 mg/dL   Total Protein 7.6 6.5 - 8.1 g/dL   Albumin 4.5 3.5 - 5.0 g/dL   AST 29 15 - 41 U/L   ALT 18 14 - 54 U/L   Alkaline Phosphatase 58 38 - 126 U/L   Total Bilirubin 1.0 0.3 - 1.2 mg/dL   GFR calc non Af Amer >60 >60 mL/min   GFR calc Af Amer >60 >60 mL/min   Anion gap 12 5 - 15   0750 IV D5LR ordered with 10 mg Reglan IV  0810 Report given to Venia CarbonJennifer Rasch who assumes care of patient [name], CNM 12/21/2015 8:12 AM   LR bolus X 1 Pepcid 20 mg X 1  PO challenge: pass Discussed patient with Dr. Su Hiltoberts   Patient states she vomited prior to going home- spit noted in the trash can. Vomited bag given to monitor Zofran 8 mg IV Phenergan 25 mg IV  Patient not actively vomiting prior to dc home.   Assessment and Plan   A:  1. Nausea and vomiting in pregnancy     P:  Discharge home in stable condition Keep your next appointment with OB as scheduled  Return to MAU if symptoms worsen Small, frequent meals Rx: protonix 40 mg daily   Duane LopeJennifer I Rasch, NP 12/21/2015 12:58 PM

## 2015-12-21 NOTE — MAU Note (Signed)
Pt reports she has been vomiting for 24 hours, unable to keep meds down.

## 2015-12-21 NOTE — Discharge Instructions (Signed)

## 2016-02-16 ENCOUNTER — Encounter: Payer: Medicaid Other | Attending: Obstetrics and Gynecology | Admitting: Dietician

## 2016-02-16 ENCOUNTER — Ambulatory Visit (INDEPENDENT_AMBULATORY_CARE_PROVIDER_SITE_OTHER): Payer: Medicaid Other | Admitting: Licensed Clinical Social Worker

## 2016-02-16 DIAGNOSIS — Z713 Dietary counseling and surveillance: Secondary | ICD-10-CM | POA: Diagnosis present

## 2016-02-16 DIAGNOSIS — F32A Depression, unspecified: Secondary | ICD-10-CM

## 2016-02-16 DIAGNOSIS — F329 Major depressive disorder, single episode, unspecified: Secondary | ICD-10-CM

## 2016-02-16 DIAGNOSIS — F411 Generalized anxiety disorder: Secondary | ICD-10-CM | POA: Diagnosis not present

## 2016-02-16 DIAGNOSIS — O99342 Other mental disorders complicating pregnancy, second trimester: Secondary | ICD-10-CM | POA: Diagnosis not present

## 2016-02-16 DIAGNOSIS — R634 Abnormal weight loss: Secondary | ICD-10-CM | POA: Insufficient documentation

## 2016-02-16 NOTE — Progress Notes (Signed)
Medical Nutrition Therapy:  Appt start time: 1115 end time:  1215.   Assessment:  Primary concerns today: Laura Frank is here today since she is [redacted] weeks pregnant and has had unitentional weight loss.. Was 112 lbs when became pregnant (doctor's notes show starting weight of 119 lbs) and lost weight down to 105-107 lbs early in pregnancy. Two weeks ago weight was 109 lbs. Had a lot of food aversions and has had a lot of "cyclical vomiting" and has had 5 ED visits for vomiting. Now vomiting about 3 times per week (all day long). Not able to eat anything on these days. Able to consumes water slowly on these days. Eating about 2 meals per day on other days (misses breakfast). Feeling ok on days when she isn't vomiting, though gets full quickly. Really enjoys cooking and vegetables and feels like she at well before pregnancy. Having less aversions now that she was having earlier in pregnancy.  Works at [employer] 20 hours per week (just started).   Taking one prenatal vitamin per day. Was taking protonix but stopped and has acid reflux at night. Acid foods don't seem to make reflux work. Drinking a lot of water. Zofran was helping nausea but felt like it was constipating her.   Lives with her 2 roommates (father of child). Has been cooking as a trio with roommate too.   Has a lot of anxiety which can upset stomach. Seeing a counselor for the first time today. Was taking Lexapro but stopped during pregnancy.  Has constipation which was a problem before pregnancy. Has bowel pain and when she "poops she vomits". Has nausea if feels too full.    Lives in a bad area which causes her stress.   Preferred Learning Style:   No preference indicated   Learning Readiness:   Ready  MEDICATIONS:prental vitamin   DIETARY INTAKE:  Usual eating pattern includes 0-2 meals and 0-1 snacks per day.  Avoided foods include: collard greens/kale, grapefruit, fish, beef, pickled things, sauce    24-hr recall:  B ( AM):  none or fruit Snk ( AM): none  L ( PM): orange/apple or smoothie (strawberries, spinach whey protein, juice) or tomato/chicken noodle soup or [place]key sandwich Snk ( PM): none D ( PM): rice or pasta with chicken and beans with salsa or baked pork chops and broccoli Snk ( PM): cereal (honey kix or honey nut cheerios) with whole milk  Beverages: few glasses up to half gallon orange juice with calcium, water, 1 bottle diluted gatorade, decaf iced tea  Usual physical activity: [facility] for 1 mile 3 x week   Estimated energy needs: 2000 calories 225 g carbohydrates 150 g protein 56 g fat  Progress Towards Goal(s):  In progress.   Nutritional Diagnosis:  [place]-3.2 Unintentional weight loss As related to hx of nausea/vomiting during pregnancy .  As evidenced by 10 lbs weight loss during pregnancy.    Intervention:  Nutrition counseling provided. Plan: Try to eat something small every 2-3 hours if possible.  Try Boost Plus (or Ensure Plus or Breakfast Essentials). Have 1-2 per day.  Start taking Metamucil/Benefiber each day. Continue to drink at least 64 oz of fluid per day. Continue to choose high fiber foods when possible (fruit, vegetables, whole grains, beans). Try walk each day if possible. Try to add high fat foods to diet: cheese, peanut butter, whole milk yogurt, ice cream, butter, olive oil, nuts.  Keep protein bars [facility]([employer]) in purse.  Teaching Method Utilized:  Visual Auditory Hands on  Supplements given during visit include:  2 Very Vanilla Boost Plus, lot #[id number], exp 10/13/2016  Handouts given during visit include:  none  Barriers to learning/adherence to lifestyle change: financial issues, anxiety, constipation, nausea/vomiting  Demonstrated degree of understanding via:  Teach Back   Monitoring/Evaluation:  Dietary intake, exercise, and body weight in 2 week(s).

## 2016-02-16 NOTE — Patient Instructions (Addendum)
Try to eat something small every 2-3 hours if possible.  Try Boost Plus (or Ensure Plus or Breakfast Essentials). Have 1-2 per day.  Start taking Metamucil/Benefiber each day. Continue to drink at least 64 oz of fluid per day. Continue to choose high fiber foods when possible (fruit, vegetables, whole grains, beans). Try walk each day if possible. Try to add high fat foods to diet: cheese, peanut butter, whole milk yogurt, ice cream, butter, olive oil, nuts.  Keep protein bars [facility]([employer]) in purse.

## 2016-02-17 ENCOUNTER — Encounter (HOSPITAL_COMMUNITY): Payer: Self-pay | Admitting: Licensed Clinical Social Worker
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
## 2016-02-17 NOTE — Progress Notes (Signed)
Patient ID: Laura PainHannah [name], female   DOB: 1991/03/11, 25 y.o.   MRN: [id number]   After CCA was completed pt was referred to Mathis DadBrett Debney due to her eating disorder she revealed at the end of the CCA. Pt was receptive to the referral.  Vernona RiegerLisbeth S. Aliceson Dolbow, LCAS-A

## 2016-03-01 ENCOUNTER — Ambulatory Visit: Payer: 59 | Admitting: Dietician

## 2016-03-11 ENCOUNTER — Encounter (HOSPITAL_COMMUNITY): Payer: Self-pay

## 2016-03-11 ENCOUNTER — Inpatient Hospital Stay (HOSPITAL_COMMUNITY)
Admission: AD | Admit: 2016-03-11 | Discharge: 2016-03-11 | Disposition: A | Discharge status: Home / Self Care | Payer: Medicaid Other | Source: Ambulatory Visit | Attending: Obstetrics and Gynecology | Admitting: Obstetrics and Gynecology

## 2016-03-11 DIAGNOSIS — E860 Dehydration: Secondary | ICD-10-CM | POA: Diagnosis not present

## 2016-03-11 DIAGNOSIS — Z3A25 25 weeks gestation of pregnancy: Secondary | ICD-10-CM | POA: Diagnosis not present

## 2016-03-11 DIAGNOSIS — E876 Hypokalemia: Secondary | ICD-10-CM | POA: Diagnosis not present

## 2016-03-11 DIAGNOSIS — F411 Generalized anxiety disorder: Secondary | ICD-10-CM

## 2016-03-11 DIAGNOSIS — O99352 Diseases of the nervous system complicating pregnancy, second trimester: Secondary | ICD-10-CM | POA: Diagnosis present

## 2016-03-11 DIAGNOSIS — G43A0 Cyclical vomiting, not intractable: Secondary | ICD-10-CM | POA: Diagnosis present

## 2016-03-11 DIAGNOSIS — Z79899 Other long term (current) drug therapy: Secondary | ICD-10-CM | POA: Diagnosis not present

## 2016-03-11 DIAGNOSIS — R1115 Cyclical vomiting syndrome unrelated to migraine: Secondary | ICD-10-CM

## 2016-03-11 DIAGNOSIS — O99342 Other mental disorders complicating pregnancy, second trimester: Secondary | ICD-10-CM | POA: Insufficient documentation

## 2016-03-11 DIAGNOSIS — O219 Vomiting of pregnancy, unspecified: Secondary | ICD-10-CM | POA: Diagnosis not present

## 2016-03-11 DIAGNOSIS — Z3492 Encounter for supervision of normal pregnancy, unspecified, second trimester: Secondary | ICD-10-CM

## 2016-03-11 DIAGNOSIS — R112 Nausea with vomiting, unspecified: Secondary | ICD-10-CM | POA: Diagnosis present

## 2016-03-11 DIAGNOSIS — O99282 Endocrine, nutritional and metabolic diseases complicating pregnancy, second trimester: Secondary | ICD-10-CM | POA: Insufficient documentation

## 2016-03-11 DIAGNOSIS — O99612 Diseases of the digestive system complicating pregnancy, second trimester: Secondary | ICD-10-CM | POA: Diagnosis not present

## 2016-03-11 LAB — URINE MICROSCOPIC-ADD ON

## 2016-03-11 LAB — COMPREHENSIVE METABOLIC PANEL
ALK PHOS: 68 U/L (ref 38–126)
ALT: 23 U/L (ref 14–54)
AST: 25 U/L (ref 15–41)
Albumin: 3.8 g/dL (ref 3.5–5.0)
Anion gap: 11 (ref 5–15)
BUN: 6 mg/dL (ref 6–20)
CALCIUM: 9.6 mg/dL (ref 8.9–10.3)
CO2: 23 mmol/L (ref 22–32)
CREATININE: 0.46 mg/dL (ref 0.44–1.00)
Chloride: 101 mmol/L (ref 101–111)
GFR calc non Af Amer: >60 mL/min (ref >60)
GLUCOSE: 96 mg/dL (ref 65–99)
Potassium: 3.3 mmol/L — ABNORMAL LOW (ref 3.5–5.1)
SODIUM: 135 mmol/L (ref 135–145)
Total Bilirubin: 0.7 mg/dL (ref 0.3–1.2)
Total Protein: 6.4 g/dL — ABNORMAL LOW (ref 6.5–8.1)

## 2016-03-11 LAB — CBC
HCT: 35.9 % — ABNORMAL LOW (ref 36.0–46.0)
HEMOGLOBIN: 12.5 g/dL (ref 12.0–15.0)
MCH: 31.6 pg (ref 26.0–34.0)
MCHC: 34.8 g/dL (ref 30.0–36.0)
MCV: 90.7 fL (ref 78.0–100.0)
Platelets: 264 10*3/uL (ref 150–400)
RBC: 3.96 MIL/uL (ref 3.87–5.11)
RDW: 13.2 % (ref 11.5–15.5)
WBC: 13.4 10*3/uL — ABNORMAL HIGH (ref 4.0–10.5)

## 2016-03-11 LAB — URINALYSIS, ROUTINE W REFLEX MICROSCOPIC
Bilirubin Urine: NEGATIVE
Glucose, UA: NEGATIVE mg/dL
Ketones, ur: >80 mg/dL — AB
LEUKOCYTES UA: NEGATIVE
NITRITE: NEGATIVE
PROTEIN: NEGATIVE mg/dL
Specific Gravity, Urine: 1.02 (ref 1.005–1.030)
pH: 7.5 (ref 5.0–8.0)

## 2016-03-11 MED ORDER — PROMETHAZINE HCL 25 MG/ML IJ SOLN
12.5000 mg | Freq: Once | INTRAMUSCULAR | Status: AC
  Administered 2016-03-11: 12.5 mg via INTRAVENOUS
  Filled 2016-03-11: qty 1

## 2016-03-11 MED ORDER — LACTATED RINGERS IV BOLUS (SEPSIS)
1000.0000 mL | Freq: Once | INTRAVENOUS | Status: AC
  Administered 2016-03-11: 1000 mL via INTRAVENOUS

## 2016-03-11 MED ORDER — HYDROXYZINE HCL 50 MG/ML IM SOLN
50.0000 mg | Freq: Once | INTRAMUSCULAR | Status: AC
  Administered 2016-03-11: 50 mg via INTRAMUSCULAR
  Filled 2016-03-11: qty 1

## 2016-03-11 MED ORDER — HYDROXYZINE PAMOATE 25 MG PO CAPS: 25.0000 mg | ORAL_CAPSULE | Freq: Three times a day (TID) | ORAL | 0 refills | Status: DC | PRN

## 2016-03-11 MED ORDER — POTASSIUM CHLORIDE ER 10 MEQ PO TBCR: 10.0000 meq | EXTENDED_RELEASE_TABLET | Freq: Two times a day (BID) | ORAL | 0 refills | Status: DC

## 2016-03-11 MED ORDER — FAMOTIDINE IN NACL 20-0.9 MG/50ML-% IV SOLN
20.0000 mg | Freq: Once | INTRAVENOUS | Status: AC
  Administered 2016-03-11: 20 mg via INTRAVENOUS
  Filled 2016-03-11: qty 50

## 2016-03-11 NOTE — MAU Note (Signed)
Pt started taking Zoloft last night at 2230.  Has generalized anxiety disorder, uncontrollable vomiting due to the anxiety.  Diaphram discomfort due to the vomiting, protonix and tums not helping with the reflux.  Has been vomiting for 12 hours.  Very anxious and uncomfortable.  Denies LOF/VB/cramping.

## 2016-03-11 NOTE — MAU Provider Note (Signed)
History     CSN: [id number]  Arrival date and time: 03/11/16 1330   First Provider Initiated Contact with Patient 03/11/16 1424      Chief Complaint  Patient presents with  . Emesis  . Anxiety   G2P0010 @25 .1 weeks presenting with vomiting x12 hours. She denies fever, chills, and diarrhea. She denies sick contacts. She reports good FM. No VB, LOF, and ctx. She has hx of cyclic vomiting syndrome worsened by anxiety. She was started on Zoloft last night. She reports increased anxiety since 3 days ago when she flew back from [place]. She had another episode of emesis shortly after that and was seen in ED in [place] for rehydration. She reports worsening anxiety yesterday after she was late for [place] appointment and had to talk with office manager.    OB History    Gravida Para Term Preterm AB Living   2       1 0   SAB TAB Ectopic Multiple Live Births     1            Past Medical History:  Diagnosis Date  . Cyclic vomiting syndrome 01/05/2015   marijuana induced emesis  . Generalized anxiety disorder   . GERD (gastroesophageal reflux disease)   . Marijuana use    daily  . Panic attacks     Past Surgical History:  Procedure Laterality Date  . NO PAST SURGERIES      Family History  Problem Relation Age of Onset  . Alcohol abuse Neg Hx   . Arthritis Neg Hx   . Asthma Neg Hx   . Birth defects Neg Hx   . Cancer Neg Hx   . COPD Neg Hx   . Depression Neg Hx   . Diabetes Neg Hx   . Drug abuse Neg Hx   . Early death Neg Hx   . Hearing loss Neg Hx   . Heart disease Neg Hx   . Hyperlipidemia Neg Hx   . Hypertension Neg Hx   . Kidney disease Neg Hx   . Learning disabilities Neg Hx   . Mental illness Neg Hx   . Mental retardation Neg Hx   . Miscarriages / Stillbirths Neg Hx   . Stroke Neg Hx   . Vision loss Neg Hx   . Varicose Veins Neg Hx     Social History  Substance Use Topics  . Smoking status: Never Smoker  . Smokeless tobacco: Never Used  . Alcohol use No     Allergies: No Known Allergies  Prescriptions Prior to Admission  Medication Sig Dispense Refill Last Dose  . acetaminophen (TYLENOL) 325 MG tablet Take 650 mg by mouth every 6 (six) hours as needed for mild pain.   Past Week at Unknown time  . diphenhydrAMINE (BENADRYL) 25 MG tablet Take 50 mg by mouth every 6 (six) hours as needed for sleep.   Past Week at Unknown time  . Doxylamine-Pyridoxine 10-10 MG TBEC 2 tablets at bedtime daily.  Also may additionally use 1 tab in the morning before breakfast and 1 tab in the afternoon before lunch. 60 tablet 1 12/20/2015 at Unknown time  . metoCLOPramide (REGLAN) 10 MG tablet Take 10 mg by mouth every 6 (six) hours as needed for nausea.   12/21/2015 at Unknown time  . ondansetron (ZOFRAN-ODT) 8 MG disintegrating tablet Take 8 mg by mouth 2 (two) times daily as needed for nausea or vomiting.   1 12/21/2015 at Unknown time  .  pantoprazole (PROTONIX) 40 MG tablet Take 1 tablet (40 mg total) by mouth daily. STOP OMEPRAZOLE (PRILOSEC) WHEN USINGTHIS MEDICATION 30 tablet 1   . Prenatal Vit-Fe Fumarate-FA (PRENATAL MULTIVITAMIN) TABS tablet Take 1 tablet by mouth daily at 12 noon.   12/20/2015 at Unknown time    Review of Systems  Constitutional: Negative.   Gastrointestinal: Positive for constipation, heartburn, nausea and vomiting. Negative for abdominal pain.  Neurological: Positive for dizziness.   Physical Exam   Blood pressure 119/61, pulse 78, temperature 98.3 F (36.8 C), temperature source Oral, resp. rate 16, last menstrual period 11/18/2014, unknown if currently breastfeeding.  Physical Exam  Constitutional: She is oriented to person, place, and time. She appears well-developed and well-nourished. No distress.  HENT:  Head: Normocephalic and atraumatic.  Neck: Normal range of motion. Neck supple.  Cardiovascular: Normal rate and regular rhythm.   Respiratory: Effort normal and breath sounds normal.  GI: Soft. Bowel sounds are normal. She  exhibits no distension. There is no tenderness.  gravid  Musculoskeletal: Normal range of motion.  Neurological: She is alert and oriented to person, place, and time.  Skin: Skin is warm and dry.  Psychiatric: Her mood appears anxious (mildly).  EFM: 140 bpm, mod variability, + accels, no decels Toco: none  Results for orders placed or performed during the hospital encounter of 03/11/16 (from the past 24 hour(s))  Urinalysis, Routine w reflex microscopic (not at [facility])     Status: Abnormal   Collection Time: 03/11/16  1:30 PM  Result Value Ref Range   Color, Urine YELLOW YELLOW   APPearance CLEAR CLEAR   Specific Gravity, Urine 1.020 1.005 - 1.030   pH 7.5 5.0 - 8.0   Glucose, UA NEGATIVE NEGATIVE mg/dL   Hgb urine dipstick TRACE (A) NEGATIVE   Bilirubin Urine NEGATIVE NEGATIVE   Ketones, ur >80 (A) NEGATIVE mg/dL   Protein, ur NEGATIVE NEGATIVE mg/dL   Nitrite NEGATIVE NEGATIVE   Leukocytes, UA NEGATIVE NEGATIVE  Urine microscopic-add on     Status: Abnormal   Collection Time: 03/11/16  1:30 PM  Result Value Ref Range   Squamous Epithelial / LPF 0-5 (A) NONE SEEN   WBC, UA 0-5 0 - 5 WBC/hpf   RBC / HPF 0-5 0 - 5 RBC/hpf   Bacteria, UA FEW (A) NONE SEEN  CBC     Status: Abnormal   Collection Time: 03/11/16  3:01 PM  Result Value Ref Range   WBC 13.4 (H) 4.0 - 10.5 K/uL   RBC 3.96 3.87 - 5.11 MIL/uL   Hemoglobin 12.5 12.0 - 15.0 g/dL   HCT 16.1 (L) 09.6 - 04.5 %   MCV 90.7 78.0 - 100.0 fL   MCH 31.6 26.0 - 34.0 pg   MCHC 34.8 30.0 - 36.0 g/dL   RDW 40.9 81.1 - 91.4 %   Platelets 264 150 - 400 K/uL  Comprehensive metabolic panel     Status: Abnormal   Collection Time: 03/11/16  3:01 PM  Result Value Ref Range   Sodium 135 135 - 145 mmol/L   Potassium 3.3 (L) 3.5 - 5.1 mmol/L   Chloride 101 101 - 111 mmol/L   CO2 23 22 - 32 mmol/L   Glucose, Bld 96 65 - 99 mg/dL   BUN 6 6 - 20 mg/dL   Creatinine, Ser 7.82 0.44 - 1.00 mg/dL   Calcium 9.6 8.9 - 95.6 mg/dL   Total  Protein 6.4 (L) 6.5 - 8.1 g/dL   Albumin 3.8 3.5 -  5.0 g/dL   AST 25 15 - 41 U/L   ALT 23 14 - 54 U/L   Alkaline Phosphatase 68 38 - 126 U/L   Total Bilirubin 0.7 0.3 - 1.2 mg/dL   GFR calc non Af Amer >60 >60 mL/min   GFR calc Af Amer >60 >60 mL/min   Anion gap 11 5 - 15    MAU Course  Procedures LR IV 1 L bolus Phenergan 12.5 mg IV x1 Pepcid 20 mg IV x1 Vistaril 50 mg Im x1  MDM Labs ordered and reviewed. Discussed presentation, exam findings, and plan for management with Dr. Estanislado Pandyivard. Pt feels much better. No episodes of emesis since meds given. Stable for discharge home.  Assessment and Plan   1. Non-intractable cyclical vomiting with nausea   2. Dehydration   3. Generalized anxiety disorder   4. Second trimester pregnancy   5. Hypokalemia    Discharge home Vistaril 25 mg 1 po q8 hrs prn #30, no refill Continue Zoloft Potassium Chloride 20 meq bid x3 days #6, no refill Follow up at [facility] in 4 weeks or sooner if needed  Donette LarryMelanie Demya Scruggs, CNM 03/11/2016, 2:25 PM

## 2016-04-28 ENCOUNTER — Inpatient Hospital Stay (HOSPITAL_COMMUNITY)
Admission: AD | Admit: 2016-04-28 | Discharge: 2016-05-01 | DRG: 781 | Disposition: A | Discharge status: Home / Self Care | Payer: Medicaid Other | Source: Ambulatory Visit | Attending: Obstetrics & Gynecology | Admitting: Obstetrics & Gynecology

## 2016-04-28 DIAGNOSIS — O26899 Other specified pregnancy related conditions, unspecified trimester: Secondary | ICD-10-CM

## 2016-04-28 DIAGNOSIS — O0933 Supervision of pregnancy with insufficient antenatal care, third trimester: Secondary | ICD-10-CM

## 2016-04-28 DIAGNOSIS — O99343 Other mental disorders complicating pregnancy, third trimester: Secondary | ICD-10-CM | POA: Diagnosis present

## 2016-04-28 DIAGNOSIS — F1290 Cannabis use, unspecified, uncomplicated: Secondary | ICD-10-CM | POA: Diagnosis present

## 2016-04-28 DIAGNOSIS — F411 Generalized anxiety disorder: Secondary | ICD-10-CM | POA: Diagnosis present

## 2016-04-28 DIAGNOSIS — O99613 Diseases of the digestive system complicating pregnancy, third trimester: Secondary | ICD-10-CM | POA: Diagnosis present

## 2016-04-28 DIAGNOSIS — K589 Irritable bowel syndrome without diarrhea: Secondary | ICD-10-CM | POA: Diagnosis present

## 2016-04-28 DIAGNOSIS — Z3A32 32 weeks gestation of pregnancy: Secondary | ICD-10-CM

## 2016-04-28 DIAGNOSIS — O212 Late vomiting of pregnancy: Principal | ICD-10-CM | POA: Diagnosis present

## 2016-04-28 DIAGNOSIS — O99323 Drug use complicating pregnancy, third trimester: Secondary | ICD-10-CM | POA: Diagnosis present

## 2016-04-28 DIAGNOSIS — G43A0 Cyclical vomiting, not intractable: Secondary | ICD-10-CM | POA: Diagnosis present

## 2016-04-28 DIAGNOSIS — K5900 Constipation, unspecified: Secondary | ICD-10-CM

## 2016-04-28 DIAGNOSIS — R112 Nausea with vomiting, unspecified: Secondary | ICD-10-CM

## 2016-04-28 DIAGNOSIS — Z363 Encounter for antenatal screening for malformations: Secondary | ICD-10-CM

## 2016-04-28 DIAGNOSIS — K219 Gastro-esophageal reflux disease without esophagitis: Secondary | ICD-10-CM | POA: Diagnosis present

## 2016-04-28 DIAGNOSIS — R109 Unspecified abdominal pain: Secondary | ICD-10-CM

## 2016-04-28 DIAGNOSIS — R1115 Cyclical vomiting syndrome unrelated to migraine: Secondary | ICD-10-CM | POA: Diagnosis present

## 2016-04-29 ENCOUNTER — Encounter (HOSPITAL_COMMUNITY): Payer: Self-pay | Admitting: *Deleted

## 2016-04-29 ENCOUNTER — Inpatient Hospital Stay (HOSPITAL_COMMUNITY): Payer: Medicaid Other

## 2016-04-29 ENCOUNTER — Observation Stay (HOSPITAL_COMMUNITY): Payer: Medicaid Other

## 2016-04-29 DIAGNOSIS — O99323 Drug use complicating pregnancy, third trimester: Secondary | ICD-10-CM | POA: Diagnosis present

## 2016-04-29 DIAGNOSIS — K5900 Constipation, unspecified: Secondary | ICD-10-CM | POA: Diagnosis present

## 2016-04-29 DIAGNOSIS — F1290 Cannabis use, unspecified, uncomplicated: Secondary | ICD-10-CM | POA: Diagnosis present

## 2016-04-29 DIAGNOSIS — F411 Generalized anxiety disorder: Secondary | ICD-10-CM | POA: Diagnosis present

## 2016-04-29 DIAGNOSIS — G43A0 Cyclical vomiting, not intractable: Secondary | ICD-10-CM | POA: Diagnosis present

## 2016-04-29 DIAGNOSIS — O99343 Other mental disorders complicating pregnancy, third trimester: Secondary | ICD-10-CM | POA: Diagnosis present

## 2016-04-29 DIAGNOSIS — K219 Gastro-esophageal reflux disease without esophagitis: Secondary | ICD-10-CM | POA: Diagnosis present

## 2016-04-29 DIAGNOSIS — Z3A32 32 weeks gestation of pregnancy: Secondary | ICD-10-CM | POA: Diagnosis not present

## 2016-04-29 DIAGNOSIS — O99613 Diseases of the digestive system complicating pregnancy, third trimester: Secondary | ICD-10-CM | POA: Diagnosis present

## 2016-04-29 DIAGNOSIS — O212 Late vomiting of pregnancy: Secondary | ICD-10-CM | POA: Diagnosis present

## 2016-04-29 DIAGNOSIS — K589 Irritable bowel syndrome without diarrhea: Secondary | ICD-10-CM | POA: Diagnosis present

## 2016-04-29 LAB — CBC WITH DIFFERENTIAL/PLATELET
Basophils Absolute: 0 10*3/uL (ref 0.0–0.1)
Basophils Absolute: 0 10*3/uL (ref 0.0–0.1)
Basophils Relative: 0 %
Basophils Relative: 0 %
EOS PCT: 0 %
Eosinophils Absolute: 0 10*3/uL (ref 0.0–0.7)
Eosinophils Absolute: 0 10*3/uL (ref 0.0–0.7)
Eosinophils Relative: 0 %
HCT: 38.4 % (ref 36.0–46.0)
HCT: 34 % — ABNORMAL LOW (ref 36.0–46.0)
HEMOGLOBIN: 14 g/dL (ref 12.0–15.0)
HEMOGLOBIN: 12 g/dL (ref 12.0–15.0)
LYMPHS ABS: 2.2 10*3/uL (ref 0.7–4.0)
LYMPHS ABS: 1.8 10*3/uL (ref 0.7–4.0)
LYMPHS PCT: 12 %
LYMPHS PCT: 10 %
MCH: 32.2 pg (ref 26.0–34.0)
MCH: 31.9 pg (ref 26.0–34.0)
MCHC: 36.5 g/dL — AB (ref 30.0–36.0)
MCHC: 35.3 g/dL (ref 30.0–36.0)
MCV: 88.3 fL (ref 78.0–100.0)
MCV: 90.4 fL (ref 78.0–100.0)
MONOS PCT: 5 %
Monocytes Absolute: 0.9 10*3/uL (ref 0.1–1.0)
Monocytes Absolute: 0.6 10*3/uL (ref 0.1–1.0)
Monocytes Relative: 3 %
NEUTROS PCT: 87 %
Neutro Abs: 14.4 10*3/uL — ABNORMAL HIGH (ref 1.7–7.7)
Neutro Abs: 15.9 10*3/uL — ABNORMAL HIGH (ref 1.7–7.7)
Neutrophils Relative %: 83 %
PLATELETS: 260 10*3/uL (ref 150–400)
Platelets: 220 10*3/uL (ref 150–400)
RBC: 4.35 MIL/uL (ref 3.87–5.11)
RBC: 3.76 MIL/uL — AB (ref 3.87–5.11)
RDW: 12.6 % (ref 11.5–15.5)
RDW: 12.7 % (ref 11.5–15.5)
WBC: 17.5 10*3/uL — AB (ref 4.0–10.5)
WBC: 18.3 10*3/uL — AB (ref 4.0–10.5)

## 2016-04-29 LAB — URINE MICROSCOPIC-ADD ON: RBC / HPF: NONE SEEN RBC/hpf (ref 0–5)

## 2016-04-29 LAB — RAPID URINE DRUG SCREEN, HOSP PERFORMED
Amphetamines: NOT DETECTED
BENZODIAZEPINES: NOT DETECTED
Barbiturates: NOT DETECTED
Cocaine: NOT DETECTED
OPIATES: NOT DETECTED
Tetrahydrocannabinol: POSITIVE — AB

## 2016-04-29 LAB — COMPREHENSIVE METABOLIC PANEL
ALK PHOS: 113 U/L (ref 38–126)
ALT: 16 U/L (ref 14–54)
ANION GAP: 15 (ref 5–15)
AST: 24 U/L (ref 15–41)
Albumin: 3.9 g/dL (ref 3.5–5.0)
BUN: 5 mg/dL — ABNORMAL LOW (ref 6–20)
CALCIUM: 10.1 mg/dL (ref 8.9–10.3)
CO2: 19 mmol/L — ABNORMAL LOW (ref 22–32)
CREATININE: 0.59 mg/dL (ref 0.44–1.00)
Chloride: 95 mmol/L — ABNORMAL LOW (ref 101–111)
Glucose, Bld: 109 mg/dL — ABNORMAL HIGH (ref 65–99)
Potassium: 3.5 mmol/L (ref 3.5–5.1)
Sodium: 129 mmol/L — ABNORMAL LOW (ref 135–145)
Total Bilirubin: 1.4 mg/dL — ABNORMAL HIGH (ref 0.3–1.2)
Total Protein: 7.2 g/dL (ref 6.5–8.1)

## 2016-04-29 LAB — TYPE AND SCREEN
ABO/RH(D): O POS
Antibody Screen: NEGATIVE

## 2016-04-29 LAB — URINALYSIS, ROUTINE W REFLEX MICROSCOPIC
BILIRUBIN URINE: NEGATIVE
Glucose, UA: NEGATIVE mg/dL
NITRITE: NEGATIVE
Protein, ur: NEGATIVE mg/dL
pH: 6 (ref 5.0–8.0)

## 2016-04-29 LAB — AMYLASE: AMYLASE: 93 U/L (ref 28–100)

## 2016-04-29 LAB — LIPASE, BLOOD: LIPASE: 26 U/L (ref 11–51)

## 2016-04-29 LAB — ABO/RH: ABO/RH(D): O POS

## 2016-04-29 MED ORDER — CALCIUM CARBONATE ANTACID 500 MG PO CHEW
2.0000 | CHEWABLE_TABLET | ORAL | Status: DC | PRN
  Administered 2016-04-29 – 2016-04-30 (×3): 400 mg via ORAL
  Filled 2016-04-29 (×3): qty 2

## 2016-04-29 MED ORDER — NIFEDIPINE 10 MG PO CAPS: 10.0000 mg | ORAL_CAPSULE | ORAL | Status: DC

## 2016-04-29 MED ORDER — ZOLPIDEM TARTRATE 5 MG PO TABS
5.0000 mg | ORAL_TABLET | Freq: Every evening | ORAL | Status: DC | PRN
  Administered 2016-04-29: 5 mg via ORAL
  Filled 2016-04-29: qty 1

## 2016-04-29 MED ORDER — ACETAMINOPHEN 325 MG PO TABS: 650.0000 mg | ORAL_TABLET | ORAL | Status: DC | PRN

## 2016-04-29 MED ORDER — HYDROMORPHONE HCL 1 MG/ML IJ SOLN
1.0000 mg | Freq: Once | INTRAMUSCULAR | Status: AC
  Administered 2016-04-29: 1 mg via INTRAVENOUS
  Filled 2016-04-29: qty 1

## 2016-04-29 MED ORDER — NIFEDIPINE 10 MG PO CAPS
20.0000 mg | ORAL_CAPSULE | Freq: Once | ORAL | Status: AC
Start: 2016-04-29 — End: 2016-04-29
  Administered 2016-04-29: 20 mg via ORAL
  Filled 2016-04-29: qty 2

## 2016-04-29 MED ORDER — SERTRALINE HCL 25 MG PO TABS
25.0000 mg | ORAL_TABLET | Freq: Every day | ORAL | Status: DC
  Administered 2016-04-29: 25 mg via ORAL
  Filled 2016-04-29 (×2): qty 1

## 2016-04-29 MED ORDER — SODIUM CHLORIDE 0.9 % IV SOLN
8.0000 mg | Freq: Once | INTRAVENOUS | Status: AC
  Administered 2016-04-29: 8 mg via INTRAVENOUS
  Filled 2016-04-29: qty 4

## 2016-04-29 MED ORDER — HYDROMORPHONE HCL 1 MG/ML IJ SOLN
1.0000 mg | INTRAMUSCULAR | Status: DC | PRN
  Administered 2016-04-29 – 2016-04-30 (×3): 1 mg via INTRAVENOUS
  Filled 2016-04-29 (×5): qty 1

## 2016-04-29 MED ORDER — FAMOTIDINE IN NACL 20-0.9 MG/50ML-% IV SOLN
20.0000 mg | Freq: Two times a day (BID) | INTRAVENOUS | Status: DC
  Administered 2016-04-29 – 2016-05-01 (×5): 20 mg via INTRAVENOUS
  Filled 2016-04-29 (×5): qty 50

## 2016-04-29 MED ORDER — HYDROMORPHONE HCL 1 MG/ML IJ SOLN: 1.0000 mg | INTRAMUSCULAR | Status: DC | PRN

## 2016-04-29 MED ORDER — PYRIDOXINE HCL 100 MG/ML IJ SOLN
100.0000 mg | Freq: Every day | INTRAMUSCULAR | Status: DC
  Administered 2016-04-29 – 2016-05-01 (×3): 100 mg via INTRAVENOUS
  Filled 2016-04-29 (×3): qty 1

## 2016-04-29 MED ORDER — NIFEDIPINE ER 30 MG PO TB24
30.0000 mg | ORAL_TABLET | Freq: Two times a day (BID) | ORAL | Status: DC
  Administered 2016-04-29 – 2016-05-01 (×4): 30 mg via ORAL
  Filled 2016-04-29 (×4): qty 1

## 2016-04-29 MED ORDER — LACTATED RINGERS IV BOLUS (SEPSIS)
1000.0000 mL | Freq: Once | INTRAVENOUS | Status: AC
  Administered 2016-04-29: 1000 mL via INTRAVENOUS

## 2016-04-29 MED ORDER — INFLUENZA VAC SPLIT QUAD 0.5 ML IM SUSY
0.5000 mL | PREFILLED_SYRINGE | INTRAMUSCULAR | Status: DC
  Filled 2016-04-29: qty 0.5

## 2016-04-29 MED ORDER — LACTATED RINGERS IV SOLN: INTRAVENOUS | Status: DC

## 2016-04-29 MED ORDER — SODIUM CHLORIDE 0.9 % IV SOLN
25.0000 mg | INTRAVENOUS | Status: DC
  Administered 2016-04-29 – 2016-04-30 (×3): 25 mg via INTRAVENOUS
  Filled 2016-04-29 (×3): qty 1

## 2016-04-29 MED ORDER — DOCUSATE SODIUM 100 MG PO CAPS
100.0000 mg | ORAL_CAPSULE | Freq: Every day | ORAL | Status: DC
  Administered 2016-04-29 – 2016-05-01 (×2): 100 mg via ORAL
  Filled 2016-04-29 (×4): qty 1

## 2016-04-29 MED ORDER — SODIUM CHLORIDE 0.9 % IV SOLN
INTRAVENOUS | Status: DC
  Administered 2016-04-29 – 2016-04-30 (×2): via INTRAVENOUS

## 2016-04-29 MED ORDER — PRENATAL MULTIVITAMIN CH
1.0000 | ORAL_TABLET | Freq: Every day | ORAL | Status: DC
  Administered 2016-05-01: 1 via ORAL
  Filled 2016-04-29 (×3): qty 1

## 2016-04-29 MED ORDER — NIFEDIPINE 10 MG PO CAPS
10.0000 mg | ORAL_CAPSULE | Freq: Four times a day (QID) | ORAL | Status: DC
  Administered 2016-04-29 (×2): 10 mg via ORAL
  Filled 2016-04-29 (×2): qty 1

## 2016-04-29 MED ORDER — PROMETHAZINE HCL 25 MG/ML IJ SOLN
12.5000 mg | Freq: Four times a day (QID) | INTRAMUSCULAR | Status: DC | PRN
  Administered 2016-04-29 (×2): 12.5 mg via INTRAVENOUS
  Filled 2016-04-29 (×2): qty 1

## 2016-04-29 MED ORDER — LACTATED RINGERS IV BOLUS (SEPSIS)
500.0000 mL | Freq: Once | INTRAVENOUS | Status: AC
  Administered 2016-04-29: 500 mL via INTRAVENOUS

## 2016-04-29 NOTE — Progress Notes (Signed)
Arrived for shift report, patient very stressed, baby having possible decels, MD notified.  Dr. Alysia PennaErvin at bedside currently and wishes for continuous monitoring for now. [id number]  Another possible decel.

## 2016-04-29 NOTE — Progress Notes (Signed)
On review of lab, low Na was noted, MD notified.  See new orders.

## 2016-04-29 NOTE — MAU Note (Signed)
PT  SAYS SHE HAS VOMITED  ALL PREG.    HAS HAD ABD  PAIN X3 DAYS    -   ALL OVER  ABD,     DID   NOT  CALL OFFICE  .    HAS HAD  CONSTIPATION- NO BM  X72 HRS-  DID  FLEETS  ENEMA - NO RESULTS.       .     NOT  ABLE  TO EAT.

## 2016-04-29 NOTE — Progress Notes (Signed)
Laura Frank was receptive to the visit, but found it difficult to talk.  Speaking made her nausea worsen.  She did share that she has good support from her family and from FOB.  She mentioned that she sees a therapist and spoke specifically about her anxiety.  As we were talking about her anxiety, her symptoms became acute.  I stayed for several minutes as her mother provided calming techniques.   Our visit was short because of her symptoms.  I would advise that Social Work come to see her as well to help get a broader picture of her hx of anxiety.  I left a message with SW.  Kathleen ArgueChaplain Katy Kanoe Wanner, Bcc Pager, [phone] 3:26 PM    04/29/16 1500  Clinical Encounter Type  Visited With Patient;Patient and family together  Visit Type Spiritual support  Referral From Nurse

## 2016-04-29 NOTE — Progress Notes (Signed)
At 0645 went in to check on patient found her asleep folded over in the bed, woke her up to reposition her and she complained of nausea, stomach pain, and a feeling of acid in her chest. In the process of assessing pt she began rolling from side to side holding her stomach. Applied fetal monitor and toco, fetal heart rate decels to 90s noted, notified Dr. Alysia PennaErvin, Repositioned Pt and worked to restart IV fluids. Pt stated she cut the machine off at 0615 when it started beeping. Fetal heart tones continuing to fluctuate, returned to 150s with 15x15s noted followed by decelerations. Dr Alysia PennaErvin at bedside, new orders, Milon DikesShannon Odell has taken report and is continuing care of the pt.

## 2016-04-29 NOTE — Progress Notes (Signed)
ACULTY PRACTICE ANTEPARTUM COMPREHENSIVE PROGRESS NOTE  Laura Frank is a 25 y.o. G2P0010 at [redacted]w[redacted]d  who is admitted for N/V.   Fetal presentation is unsure. Length of Stay:  0  Days  Subjective: Pt reports starting having N/V with abd pain about 3 days ago. Problems with N/V throughout this pregnancy. Also GAD with IBS. Was being seen at [facility] but has not been seen for 4-6 weeks. Plans to continue care with them if possible.   Has been taking Zoloft for GAD. But has not taken x 1 day.  She describes the abd pain as constant and generalized discomfort. She denies any VB of LOF. Last IC was 3 days ago. No BM x 3 days. Also little diet for 3 days due to the N/V and pain W/U in the MAU revealed increased WBC, some irrablity which reasoned to Procardia x 1. Abd Xray no evidence of SBO. Cervix closed in MAU.   Vitals:  Blood pressure 120/74, pulse 85, temperature 98.5 F (36.9 C), temperature source Oral, resp. rate 18, height 5\' 3"  (1.6 m), weight 110 lb (49.9 kg), last menstrual period 11/18/2014, SpO2 97 %, unknown if currently breastfeeding. Physical Examination: General appearance -In no acute distress Lungs clear Heart RRR Abd soft, gravid, min tenderness lower abd, no rebound or gaurding Cervical Exam: Not evaluated. Extremities: Homans sign is negative, no sign of DVT  Membranes:intact  Fetal Monitoring:  Baseline: 140-150's, spontaneous decelration to 90's x 2 with resolution with pt position change bpm toco irrability Labs:  Results for orders placed or performed during the hospital encounter of 04/28/16 (from the past 24 hour(s))  Urinalysis, Routine w reflex microscopic (not at [facility])   Collection Time: 04/29/16 12:13 AM  Result Value Ref Range   Color, Urine YELLOW YELLOW   APPearance CLEAR CLEAR   Specific Gravity, Urine >1.030 (H) 1.005 - 1.030   pH 6.0 5.0 - 8.0   Glucose, UA NEGATIVE NEGATIVE mg/dL   Hgb urine dipstick TRACE (A) NEGATIVE   Bilirubin Urine  NEGATIVE NEGATIVE   Ketones, ur >80 (A) NEGATIVE mg/dL   Protein, ur NEGATIVE NEGATIVE mg/dL   Nitrite NEGATIVE NEGATIVE   Leukocytes, UA TRACE (A) NEGATIVE  Urine microscopic-add on   Collection Time: 04/29/16 12:13 AM  Result Value Ref Range   Squamous Epithelial / LPF 6-30 (A) NONE SEEN   WBC, UA 0-5 0 - 5 WBC/hpf   RBC / HPF NONE SEEN 0 - 5 RBC/hpf   Bacteria, UA FEW (A) NONE SEEN  CBC with Differential   Collection Time: 04/29/16  1:21 AM  Result Value Ref Range   WBC 17.5 (H) 4.0 - 10.5 K/uL   RBC 4.35 3.87 - 5.11 MIL/uL   Hemoglobin 14.0 12.0 - 15.0 g/dL   HCT 16.1 09.6 - 04.5 %   MCV 88.3 78.0 - 100.0 fL   MCH 32.2 26.0 - 34.0 pg   MCHC 36.5 (H) 30.0 - 36.0 g/dL   RDW 40.9 81.1 - 91.4 %   Platelets 260 150 - 400 K/uL   Neutrophils Relative % 83 %   Neutro Abs 14.4 (H) 1.7 - 7.7 K/uL   Lymphocytes Relative 12 %   Lymphs Abs 2.2 0.7 - 4.0 K/uL   Monocytes Relative 5 %   Monocytes Absolute 0.9 0.1 - 1.0 K/uL   Eosinophils Relative 0 %   Eosinophils Absolute 0.0 0.0 - 0.7 K/uL   Basophils Relative 0 %   Basophils Absolute 0.0 0.0 - 0.1 K/uL  Comprehensive metabolic panel   Collection Time: 04/29/16  1:21 AM  Result Value Ref Range   Sodium 129 (L) 135 - 145 mmol/L   Potassium 3.5 3.5 - 5.1 mmol/L   Chloride 95 (L) 101 - 111 mmol/L   CO2 19 (L) 22 - 32 mmol/L   Glucose, Bld 109 (H) 65 - 99 mg/dL   BUN 5 (L) 6 - 20 mg/dL   Creatinine, Ser 1.610.59 0.44 - 1.00 mg/dL   Calcium 09.610.1 8.9 - 04.510.3 mg/dL   Total Protein 7.2 6.5 - 8.1 g/dL   Albumin 3.9 3.5 - 5.0 g/dL   AST 24 15 - 41 U/L   ALT 16 14 - 54 U/L   Alkaline Phosphatase 113 38 - 126 U/L   Total Bilirubin 1.4 (H) 0.3 - 1.2 mg/dL   GFR calc non Af Amer >60 >60 mL/min   GFR calc Af Amer >60 >60 mL/min   Anion gap 15 5 - 15  Amylase   Collection Time: 04/29/16  1:21 AM  Result Value Ref Range   Amylase 93 28 - 100 U/L  Lipase, blood   Collection Time: 04/29/16  1:21 AM  Result Value Ref Range   Lipase 26 11 -  51 U/L  Type and screen [facility]   Collection Time: 04/29/16  1:21 AM  Result Value Ref Range   ABO/RH(D) O POS    Antibody Screen NEG    Sample Expiration 05/02/2016   ABO/Rh   Collection Time: 04/29/16  1:21 AM  Result Value Ref Range   ABO/RH(D) O POS     Imaging Studies:       Medications:  Scheduled . docusate sodium  100 mg Oral Daily  . famotidine (PEPCID) IV  20 mg Intravenous Q12H  . [START ON 04/30/2016] Influenza vac split quadrivalent PF  0.5 mL Intramuscular Tomorrow-1000  . lactated ringers  500 mL Intravenous Once  . NIFEdipine  10 mg Oral Q6H  . prenatal multivitamin  1 tablet Oral Q1200   I have reviewed the patient's current medications.  ASSESSMENT: Patient Active Problem List   Diagnosis Date Noted  . Constipation 04/29/2016  . Intractable vomiting 01/03/2015    PLAN: IUP 32 1/7 weeks Preterm Ut ctx N/V IBS GAD H/O drug abuse Elevated WBC  Pt has had spontaneous decelerations as noted. Receiving fluid bolus. Will start Procardia. Protonix, repeat CBC. Restart Zoloft. Will check U/S I do not think that the pt has an infectious process at this time. Feel N/V related to her GAD and IBS. Preterm uterine contractions may also contributing as well. Will hold BMZ as do not feel delivery is interment.      Hermina StaggersMichael L Cordera Stineman 04/29/2016,7:37 AM

## 2016-04-29 NOTE — MAU Provider Note (Signed)
MAU HISTORY AND PHYSICAL  Chief Complaint:  Emesis   Laura Frank is a 25 y.o.  G2P0010  at [redacted]w[redacted]d presenting for Emesis . Patient states she has been having  none contractions, none vaginal bleeding, intact membranes, with active fetal movement.    Has been vomiting for past 3 days. Has not been able to keep anything down. Has not had a bowel movement in 3-4 days. Reports abdominal Frank that is worsening. Denies fevers, chills, sick contacts, dysuria. Has not been seen for prenatal care in about 6 weeks as her outpatient OB no longer accepts her insurance. Denies issues with pregnancy so far.   Past Medical History:  Diagnosis Date  . Cyclic vomiting syndrome 01/05/2015   marijuana induced emesis  . Generalized anxiety disorder   . GERD (gastroesophageal reflux disease)   . Marijuana use    daily  . Panic attacks     Past Surgical History:  Procedure Laterality Date  . NO PAST SURGERIES      Family History  Problem Relation Age of Onset  . Alcohol abuse Neg Hx   . Arthritis Neg Hx   . Asthma Neg Hx   . Birth defects Neg Hx   . Cancer Neg Hx   . COPD Neg Hx   . Depression Neg Hx   . Diabetes Neg Hx   . Drug abuse Neg Hx   . Early death Neg Hx   . Hearing loss Neg Hx   . Heart disease Neg Hx   . Hyperlipidemia Neg Hx   . Hypertension Neg Hx   . Kidney disease Neg Hx   . Learning disabilities Neg Hx   . Mental illness Neg Hx   . Mental retardation Neg Hx   . Miscarriages / Stillbirths Neg Hx   . Stroke Neg Hx   . Vision loss Neg Hx   . Varicose Veins Neg Hx     Social History  Substance Use Topics  . Smoking status: Never Smoker  . Smokeless tobacco: Never Used  . Alcohol use No    No Known Allergies  Prescriptions Prior to Admission  Medication Sig Dispense Refill Last Dose  . sertraline (ZOLOFT) 25 MG tablet Take 25 mg by mouth daily.   04/28/2016 at Unknown time  . calcium carbonate (TUMS - DOSED IN MG ELEMENTAL CALCIUM) 500 MG chewable tablet Chew  1 tablet by mouth daily.   03/11/2016 at Unknown time  . hydrOXYzine (VISTARIL) 25 MG capsule Take 1 capsule (25 mg total) by mouth 3 (three) times daily as needed. 30 capsule 0   . ondansetron (ZOFRAN-ODT) 8 MG disintegrating tablet Take 8 mg by mouth 2 (two) times daily as needed for nausea or vomiting.   1 Past Week at Unknown time  . pantoprazole (PROTONIX) 40 MG tablet Take 1 tablet (40 mg total) by mouth daily. STOP OMEPRAZOLE (PRILOSEC) WHEN USINGTHIS MEDICATION 30 tablet 1 03/10/2016 at Unknown time  . potassium chloride (K-DUR) 10 MEQ tablet Take 1 tablet (10 mEq total) by mouth 2 (two) times daily. 6 tablet 0   . Prenatal Vit-Fe Fumarate-FA (PRENATAL MULTIVITAMIN) TABS tablet Take 1 tablet by mouth daily at 12 noon.   03/11/2016 at Unknown time  . promethazine (PHENERGAN) 12.5 MG tablet Take 12.5 mg by mouth every 6 (six) hours as needed for nausea or vomiting.   03/10/2016 at Unknown time    Review of Systems - Negative except for what is mentioned in HPI.  Physical Exam  Blood  pressure 138/78, pulse 98, temperature 98.3 F (36.8 C), temperature source Oral, resp. rate 20, height 5\' 3"  (1.6 m), weight 110 lb (49.9 kg), last menstrual period 11/18/2014, unknown if currently breastfeeding. GENERAL: pale, diaphoretic female in mild distress LUNGS: No respiratory distress HEART: Regular rate ABDOMEN: Soft, nontender, nondistended, gravid abdomen.  EXTREMITIES: Nontender, no edema, 2+ distal pulses. Presentation: cephalic FHT:  135 baseline, moderate variability with accelerations present, no decelerations Contractions: irritable Dilation: Closed Exam by:: Dr Riccio/Heather [name]    Labs: Results for orders placed or performed during the hospital encounter of 04/28/16 (from the past 24 hour(s))  Urinalysis, Routine w reflex microscopic ([facility])   Collection Time: 04/29/16 12:13 AM  Result Value Ref Range   Color, Urine YELLOW YELLOW   APPearance CLEAR CLEAR   Specific Gravity,  Urine >1.030 (H) 1.005 - 1.030   pH 6.0 5.0 - 8.0   Glucose, UA NEGATIVE NEGATIVE mg/dL   Hgb urine dipstick TRACE (A) NEGATIVE   Bilirubin Urine NEGATIVE NEGATIVE   Ketones, ur >80 (A) NEGATIVE mg/dL   Protein, ur NEGATIVE NEGATIVE mg/dL   Nitrite NEGATIVE NEGATIVE   Leukocytes, UA TRACE (A) NEGATIVE  Urine microscopic-add on   Collection Time: 04/29/16 12:13 AM  Result Value Ref Range   Squamous Epithelial / LPF 6-30 (A) NONE SEEN   WBC, UA 0-5 0 - 5 WBC/hpf   RBC / HPF NONE SEEN 0 - 5 RBC/hpf   Bacteria, UA FEW (A) NONE SEEN    Imaging Studies:  No results found.  Assessment: Laura Frank is  25 y.o. G2P0010 at 537w1d presents with Emesis .  MDM CBC, CMP  Plan:  Admit for observation Continue LR fluids, IV Phenergen, IV dilaudid Bowel regimen Abdominal x-ray Patient verbalizes agreement and understanding with plan  Tillman SersAngela C Riccio, DO PGY-1 10/27/20171:21 AM  CNM attestation:  I have seen and examined this patient; I agree with above documentation in the  resident's note.   Laura Frank is a 25 y.o. G2P0010 reporting abdominal Frank, constipation, vomiting.  +FM, denies LOF, VB, contractions, vaginal discharge.  PE: BP 138/78 (BP Location: Right Arm)   Pulse 98   Temp 98.3 F (36.8 C) (Oral)   Resp 20   Ht 5\' 3"  (1.6 m)   Wt 110 lb (49.9 kg)   LMP 11/18/2014   BMI 19.49 kg/m  Gen: calm comfortable, NAD Resp: normal effort, no distress Abd: gravid  ROS, labs, PMH reviewed NST reactive 135, moderate with 15x15 accels, no decels Toco: some UI   Plan: OBS on antenatal   Tawnya CrookHogan, Heather Donovan, CNM 3:23 AM

## 2016-04-30 DIAGNOSIS — R1115 Cyclical vomiting syndrome unrelated to migraine: Secondary | ICD-10-CM | POA: Diagnosis present

## 2016-04-30 DIAGNOSIS — O212 Late vomiting of pregnancy: Principal | ICD-10-CM

## 2016-04-30 DIAGNOSIS — Z3A32 32 weeks gestation of pregnancy: Secondary | ICD-10-CM

## 2016-04-30 MED ORDER — METOCLOPRAMIDE HCL 5 MG/ML IJ SOLN: 10.0000 mg | Freq: Four times a day (QID) | INTRAMUSCULAR | Status: DC

## 2016-04-30 MED ORDER — ONDANSETRON HCL 4 MG/2ML IJ SOLN
4.0000 mg | Freq: Once | INTRAMUSCULAR | Status: AC
  Administered 2016-04-30: 4 mg via INTRAVENOUS
  Filled 2016-04-30: qty 2

## 2016-04-30 MED ORDER — PANTOPRAZOLE SODIUM 40 MG PO TBEC
40.0000 mg | DELAYED_RELEASE_TABLET | Freq: Every day | ORAL | Status: DC
  Administered 2016-04-30 – 2016-05-01 (×2): 40 mg via ORAL
  Filled 2016-04-30 (×2): qty 1

## 2016-04-30 MED ORDER — METOCLOPRAMIDE HCL 5 MG/ML IJ SOLN
10.0000 mg | Freq: Once | INTRAMUSCULAR | Status: AC
  Administered 2016-04-30: 10 mg via INTRAVENOUS
  Filled 2016-04-30: qty 2

## 2016-04-30 MED ORDER — GI COCKTAIL ~~LOC~~
30.0000 mL | Freq: Two times a day (BID) | ORAL | Status: DC | PRN
  Administered 2016-04-30: 30 mL via ORAL
  Filled 2016-04-30 (×2): qty 30

## 2016-04-30 MED ORDER — SERTRALINE HCL 50 MG PO TABS
50.0000 mg | ORAL_TABLET | Freq: Every day | ORAL | Status: DC
  Administered 2016-04-30 – 2016-05-01 (×2): 50 mg via ORAL
  Filled 2016-04-30 (×2): qty 1

## 2016-04-30 NOTE — Progress Notes (Signed)
Pt highly anxious as she c/o epigastric pain after attempting to take po fluids earlier. Pt allowed to vent concerns and pain medication given to assist with the discomfort.

## 2016-04-30 NOTE — Progress Notes (Signed)
Pt keeping 120cc po apple juice down for the past 30 minutes and now feeling a little nauseated. MD notifeid and additional nausea medications ordered.

## 2016-04-30 NOTE — Progress Notes (Signed)
Pt waking up from nap eager to try po fluids.

## 2016-04-30 NOTE — H&P (Signed)
Laura ReichertHeather D Frank, CNM  Family Medicine  Cosigned by: Laura StaggersMichael L Ervin, MD at 04/29/2016 8:02 AM  Attestation signed by Laura StaggersMichael L Ervin, MD at 04/29/2016 8:02 AM  Attestation of Attending Supervision of Advanced Practice Provider (PA/CNM/NP): Evaluation and management procedures were performed by the Advanced Practice Provider under my supervision and collaboration.  I have reviewed the Advanced Practice Provider's note and chart, and I agree with the management and plan.  Laura L. Laura PennaErvin, MD, FACOG Attending Obstetrician & Gynecologist Faculty Practice, [facility]     Expand All Collapse All   [] Hide copied text [] Hover for attribution information  MAU HISTORY AND PHYSICAL  Chief Complaint:  Emesis   Laura Frank is a 25 y.o.  G2P0010  at 979w1d presenting for Emesis . Patient states she has been having  none contractions, none vaginal bleeding, intact membranes, with active fetal movement.    Has been vomiting for past 3 days. Has not been able to keep anything down. Has not had a bowel movement in 3-4 days. Reports abdominal Frank that is worsening. Denies fevers, chills, sick contacts, dysuria. Has not been seen for prenatal care in about 6 weeks as her outpatient OB no longer accepts her insurance. Denies issues with pregnancy so far.       Past Medical History:  Diagnosis Date  . Cyclic vomiting syndrome 01/05/2015   marijuana induced emesis  . Generalized anxiety disorder   . GERD (gastroesophageal reflux disease)   . Marijuana use    daily  . Panic attacks          Past Surgical History:  Procedure Laterality Date  . NO PAST SURGERIES           Family History  Problem Relation Age of Onset  . Alcohol abuse Neg Hx   . Arthritis Neg Hx   . Asthma Neg Hx   . Birth defects Neg Hx   . Cancer Neg Hx   . COPD Neg Hx   . Depression Neg Hx   . Diabetes Neg Hx   . Drug abuse Neg Hx   . Early death Neg Hx   .  Hearing loss Neg Hx   . Heart disease Neg Hx   . Hyperlipidemia Neg Hx   . Hypertension Neg Hx   . Kidney disease Neg Hx   . Learning disabilities Neg Hx   . Mental illness Neg Hx   . Mental retardation Neg Hx   . Miscarriages / Stillbirths Neg Hx   . Stroke Neg Hx   . Vision loss Neg Hx   . Varicose Veins Neg Hx         Social History  Substance Use Topics  . Smoking status: Never Smoker  . Smokeless tobacco: Never Used  . Alcohol use No    No Known Allergies         Prescriptions Prior to Admission  Medication Sig Dispense Refill Last Dose  . sertraline (ZOLOFT) 25 MG tablet Take 25 mg by mouth daily.   04/28/2016 at Unknown time  . calcium carbonate (TUMS - DOSED IN MG ELEMENTAL CALCIUM) 500 MG chewable tablet Chew 1 tablet by mouth daily.   03/11/2016 at Unknown time  . hydrOXYzine (VISTARIL) 25 MG capsule Take 1 capsule (25 mg total) by mouth 3 (three) times daily as needed. 30 capsule 0   . ondansetron (ZOFRAN-ODT) 8 MG disintegrating tablet Take 8 mg by mouth 2 (two) times daily as needed for nausea  or vomiting.   1 Past Week at Unknown time  . pantoprazole (PROTONIX) 40 MG tablet Take 1 tablet (40 mg total) by mouth daily. STOP OMEPRAZOLE (PRILOSEC) WHEN USINGTHIS MEDICATION 30 tablet 1 03/10/2016 at Unknown time  . potassium chloride (K-DUR) 10 MEQ tablet Take 1 tablet (10 mEq total) by mouth 2 (two) times daily. 6 tablet 0   . Prenatal Vit-Fe Fumarate-FA (PRENATAL MULTIVITAMIN) TABS tablet Take 1 tablet by mouth daily at 12 noon.   03/11/2016 at Unknown time  . promethazine (PHENERGAN) 12.5 MG tablet Take 12.5 mg by mouth every 6 (six) hours as needed for nausea or vomiting.   03/10/2016 at Unknown time    Review of Systems - Negative except for what is mentioned in HPI.  Physical Exam  Blood pressure 138/78, pulse 98, temperature 98.3 F (36.8 C), temperature source Oral, resp. rate 20, height 5\' 3"  (1.6 m), weight 110 lb (49.9 kg), last  menstrual period 11/18/2014, unknown if currently breastfeeding. GENERAL: pale, diaphoretic female in mild distress LUNGS: No respiratory distress HEART: Regular rate ABDOMEN: Soft, nontender, nondistended, gravid abdomen.  EXTREMITIES: Nontender, no edema, 2+ distal pulses. Presentation: cephalic FHT:  135 baseline, moderate variability with accelerations present, no decelerations Contractions: irritable Dilation: Closed Exam by:: Dr Frank/Laura [name]    Labs:      Results for orders placed or performed during the hospital encounter of 04/28/16 (from the past 24 hour(s))  Urinalysis, Routine w reflex microscopic (not at [facility])   Collection Time: 04/29/16 12:13 AM  Result Value Ref Range   Color, Urine YELLOW YELLOW   APPearance CLEAR CLEAR   Specific Gravity, Urine >1.030 (H) 1.005 - 1.030   pH 6.0 5.0 - 8.0   Glucose, UA NEGATIVE NEGATIVE mg/dL   Hgb urine dipstick TRACE (A) NEGATIVE   Bilirubin Urine NEGATIVE NEGATIVE   Ketones, ur >80 (A) NEGATIVE mg/dL   Protein, ur NEGATIVE NEGATIVE mg/dL   Nitrite NEGATIVE NEGATIVE   Leukocytes, UA TRACE (A) NEGATIVE  Urine microscopic-add on   Collection Time: 04/29/16 12:13 AM  Result Value Ref Range   Squamous Epithelial / LPF 6-30 (A) NONE SEEN   WBC, UA 0-5 0 - 5 WBC/hpf   RBC / HPF NONE SEEN 0 - 5 RBC/hpf   Bacteria, UA FEW (A) NONE SEEN    Imaging Studies:  ImagingResults  No results found.    Assessment: Laura Frank is  25 y.o. G2P0010 at 410w1d presents with Emesis .  MDM CBC, CMP  Plan:  Admit for observation Continue LR fluids, IV Phenergen, IV dilaudid Bowel regimen Abdominal x-ray Patient verbalizes agreement and understanding with plan  Tillman SersAngela C Riccio, DO PGY-1 10/27/20171:21 AM  CNM attestation:  I have seen and examined this patient; I agree with above documentation in the  resident's note.   Laura Frank is a 25 y.o. G2P0010 reporting abdominal Frank,  constipation, vomiting.  +FM, denies LOF, VB, contractions, vaginal discharge.  PE: BP 138/78 (BP Location: Right Arm)   Pulse 98   Temp 98.3 F (36.8 C) (Oral)   Resp 20   Ht 5\' 3"  (1.6 m)   Wt 110 lb (49.9 kg)   LMP 11/18/2014   BMI 19.49 kg/m  Gen: calm comfortable, NAD Resp: normal effort, no distress Abd: gravid  ROS, labs, PMH reviewed NST reactive 135, moderate with 15x15 accels, no decels Toco: some UI   Plan: OBS on antenatal   Tawnya CrookHogan, Laura Donovan, CNM 3:23 AM  Electronically signed by Tillman Sers, DO at 04/29/2016 3:17 AM Electronically signed by Laura Frank, CNM at 04/29/2016 3:24 AM Electronically signed by Laura Staggers, MD at 04/29/2016 8:02 AM

## 2016-04-30 NOTE — Progress Notes (Signed)
FACULTY PRACTICE ANTEPARTUM(COMPREHENSIVE) NOTE  Laura PainHannah Frank is a 25 y.o. G2P0010 at 7166w2d who is admitted for abdominal Frank and nausea and vomiting.   Fetal presentation is unsure. Length of Stay:  1  Days  Subjective: Upper abdominal Frank and nausea Patient reports the fetal movement as active. Patient reports uterine contraction  activity as none. Patient reports  vaginal bleeding as none. Patient describes fluid per vagina as None.  Vitals:  Blood pressure (!) 102/51, pulse 88, temperature 98.7 F (37.1 C), temperature source Oral, resp. rate 16, height 5\' 3"  (1.6 m), weight 49.9 kg (110 lb), last menstrual period 11/18/2014, SpO2 100 %, unknown if currently breastfeeding. Physical Examination:  General appearance - anxious Heart - normal rate and regular rhythm Abdomen - soft, nontender, nondistended Fundal Height:  size equals dates Cervical Exam: Not evaluated.  Extremities: extremities normal, atraumatic, no cyanosis or edema and Homans sign is negative, no sign of DVT with  Membranes:intact  Fetal Monitoring:     Fetal Heart Rate A  Mode External filed at 04/30/2016 0837  Baseline Rate (A) 140 bpm filed at 04/30/2016 0837  Variability 6-25 BPM filed at 04/30/2016 0837  Accelerations 15 x 15 filed at 04/30/2016 0837  Decelerations Variable filed at 04/30/2016 [id number]     Labs:  Results for orders placed or performed during the hospital encounter of 04/28/16 (from the past 24 hour(s))  CBC with Differential/Platelet   Collection Time: 04/29/16 12:10 PM  Result Value Ref Range   WBC 18.3 (H) 4.0 - 10.5 K/uL   RBC 3.76 (L) 3.87 - 5.11 MIL/uL   Hemoglobin 12.0 12.0 - 15.0 g/dL   HCT 96.034.0 (L) 45.436.0 - 09.846.0 %   MCV 90.4 78.0 - 100.0 fL   MCH 31.9 26.0 - 34.0 pg   MCHC 35.3 30.0 - 36.0 g/dL   RDW 11.912.7 14.711.5 - 82.915.5 %   Platelets 220 150 - 400 K/uL   Neutrophils Relative % 87 %   Neutro Abs 15.9 (H) 1.7 - 7.7 K/uL   Lymphocytes Relative 10 %   Lymphs Abs 1.8 0.7 -  4.0 K/uL   Monocytes Relative 3 %   Monocytes Absolute 0.6 0.1 - 1.0 K/uL   Eosinophils Relative 0 %   Eosinophils Absolute 0.0 0.0 - 0.7 K/uL   Basophils Relative 0 %   Basophils Absolute 0.0 0.0 - 0.1 K/uL    Imaging Studies:    Currently EPIC will not allow sonographic studies to automatically populate into notes.  In the meantime, copy and paste results into note or free text.  Medications:  Scheduled . docusate sodium  100 mg Oral Daily  . famotidine (PEPCID) IV  20 mg Intravenous Q12H  . Influenza vac split quadrivalent PF  0.5 mL Intramuscular Tomorrow-1000  . NIFEdipine  30 mg Oral BID  . prenatal multivitamin  1 tablet Oral Q1200  . pyridOXINE  100 mg Intravenous Daily  . sertraline  25 mg Oral Daily   I have reviewed the patient's current medications.  ASSESSMENT: Patient Active Problem List   Diagnosis Date Noted  . Constipation 04/29/2016  . Intractable vomiting 01/03/2015    PLAN: Add Reglan and increase Zoloft to 50 mg. GI cocktail  Laura Frank 04/30/2016,9:07 AM

## 2016-04-30 NOTE — Progress Notes (Signed)
RN entering room with patient highly anxious as her mother talked to her about po fluids. Pt allowed to vent her concerns, very uncomfortable with the fetal monitoring stating that it just hurts her belly and she is unable to sleep and relax. Fetal monitor taken off (fetal tracing reassuring) to allow patient to calm down. Dr. Debroah LoopArnold at the desk and confirmed new orders of NST QS. Pt affect and mood improving.

## 2016-05-01 ENCOUNTER — Encounter (HOSPITAL_COMMUNITY): Payer: Self-pay | Admitting: *Deleted

## 2016-05-01 MED ORDER — SERTRALINE HCL 50 MG PO TABS: 50.0000 mg | ORAL_TABLET | Freq: Every day | ORAL | 2 refills | Status: DC

## 2016-05-01 MED ORDER — PANTOPRAZOLE SODIUM 40 MG PO TBEC: 40.0000 mg | DELAYED_RELEASE_TABLET | Freq: Every day | ORAL | 2 refills | Status: DC

## 2016-05-01 NOTE — Discharge Instructions (Signed)
Heartburn During Pregnancy Heartburn is a burning sensation in the chest caused by stomach acid backing up into the esophagus. Heartburn is common in pregnancy because a certain hormone (progesterone) is released when a woman is pregnant. The progesterone hormone may relax the valve that separates the esophagus from the stomach. This allows acid to go up into the esophagus, causing heartburn. Heartburn may also happen in pregnancy because the enlarging uterus pushes up on the stomach, which pushes more acid into the esophagus. This is especially true in the later stages of pregnancy. Heartburn problems usually go away after giving birth. CAUSES  Heartburn is caused by stomach acid backing up into the esophagus. During pregnancy, this may result from various things, including:   The progesterone hormone.  Changing hormone levels.  The growing uterus pushing stomach acid upward.  Large meals.  Certain foods and drinks.  Exercise.  Increased acid production. SIGNS AND SYMPTOMS   Burning pain in the chest or lower throat.  Bitter taste in the mouth.  Coughing. DIAGNOSIS  Your health care provider will typically diagnose heartburn by taking a careful history of your concern. Blood tests may be done to check for a certain type of bacteria that is associated with heartburn. Sometimes, heartburn is diagnosed by prescribing a heartburn medicine to see if the symptoms improve. In some cases, a procedure called an endoscopy may be done. In this procedure, a tube with a light and a camera on the end (endoscope) is used to examine the esophagus and the stomach. TREATMENT  Treatment will vary depending on the severity of your symptoms. Your health care provider may recommend:  Over-the-counter medicines (antacids, acid reducers) for mild heartburn.  Prescription medicines to decrease stomach acid or to protect your stomach lining.  Certain changes in your diet.  Elevating the head of your bed  by putting blocks under the legs. This helps prevent stomach acid from backing up into the esophagus when you are lying down. HOME CARE INSTRUCTIONS   Only take over-the-counter or prescription medicines as directed by your health care provider.  Raise the head of your bed by putting blocks under the legs if instructed to do so by your health care provider. Sleeping with more pillows is not effective because it only changes the position of your head.  Do not exercise right after eating.  Avoid eating 2-3 hours before bed. Do not lie down right after eating.  Eat small meals throughout the day instead of three large meals.  Identify foods and beverages that make your symptoms worse and avoid them. Foods you may want to avoid include:  Peppers.  Chocolate.  High-fat foods, including fried foods.  Spicy foods.  Garlic and onions.  Citrus fruits, including oranges, grapefruit, lemons, and limes.  Food containing tomatoes or tomato products.  Mint.  Carbonated and caffeinated drinks.  Vinegar. SEEK MEDICAL CARE IF:  You have abdominal pain of any kind.  You feel burning in your upper abdomen or chest, especially after eating or lying down.  You have nausea and vomiting.  Your stomach feels upset after you eat. SEEK IMMEDIATE MEDICAL CARE IF:   You have severe chest pain that goes down your arm or into your jaw or neck.  You feel sweaty, dizzy, or light-headed.  You become short of breath.  You vomit blood.  You have difficulty or pain with swallowing.  You have bloody or black, tarry stools.  You have episodes of heartburn more than 3 times a week,   for more than 2 weeks. MAKE SURE YOU:  Understand these instructions.  Will watch your condition.  Will get help right away if you are not doing well or get worse.   This information is not intended to replace advice given to you by your health care provider. Make sure you discuss any questions you have with  your health care provider.   Document Released: 06/17/2000 Document Revised: 07/11/2014 Document Reviewed: 02/06/2013 Elsevier Interactive Patient Education 2016 Elsevier Inc.  

## 2016-05-01 NOTE — Discharge Summary (Signed)
Physician Discharge Summary  Patient ID: Laura PainHannah Frank MRN: [id number] DOB/AGE: [birth year] 26 y.o.  Admit date: 04/28/2016 Discharge date: 05/01/2016  Admission Diagnoses:nausea and vomiting 7430w3d   Discharge Diagnoses:  Active Problems:   Constipation   Cyclic vomiting syndrome   Discharged Condition: fair  Hospital Course: Laura Frank is a 25 y.o.  G2P0010  at 6630w1d presenting for Emesis . Patient states she has been having  none contractions, none vaginal bleeding, intact membranes, with active fetal movement.    Has been vomiting for past 3 days. Has not been able to keep anything down. Has not had a bowel movement in 3-4 days. Reports abdominal pain that is worsening. Denies fevers, chills, sick contacts, dysuria. Has not been seen for prenatal care in about 6 weeks as her outpatient OB no longer accepts her insurance. Denies issues with pregnancy so far.  She was treated for reflux and nausea and improved, able to eat at discharge     Past Medical History:     Consults: None  Significant Diagnostic Studies: labs:  CBC    Component Value Date/Time   WBC 18.3 (H) 04/29/2016 1210   RBC 3.76 (L) 04/29/2016 1210   HGB 12.0 04/29/2016 1210   HCT 34.0 (L) 04/29/2016 1210   PLT 220 04/29/2016 1210   MCV 90.4 04/29/2016 1210   MCH 31.9 04/29/2016 1210   MCHC 35.3 04/29/2016 1210   RDW 12.7 04/29/2016 1210   LYMPHSABS 1.8 04/29/2016 1210   MONOABS 0.6 04/29/2016 1210   EOSABS 0.0 04/29/2016 1210   BASOSABS 0.0 04/29/2016 1210   CMP Latest Ref Rng & Units 04/29/2016 03/11/2016 12/21/2015  Glucose 65 - 99 mg/dL 409(W109(H) 96 119(J131(H)  BUN 6 - 20 mg/dL 5(L) 6 7  Creatinine 4.780.44 - 1.00 mg/dL 2.950.59 6.210.46 3.080.46  Sodium 135 - 145 mmol/L 129(L) 135 135  Potassium 3.5 - 5.1 mmol/L 3.5 3.3(L) 3.4(L)  Chloride 101 - 111 mmol/L 95(L) 101 102  CO2 22 - 32 mmol/L 19(L) 23 21(L)  Calcium 8.9 - 10.3 mg/dL 65.710.1 9.6 10.4(H)  Total Protein 6.5 - 8.1 g/dL 7.2 6.4(L) 7.6  Total  Bilirubin 0.3 - 1.2 mg/dL 8.4(O1.4(H) 0.7 1.0  Alkaline Phos 38 - 126 U/L 113 68 58  AST 15 - 41 U/L 24 25 29   ALT 14 - 54 U/L 16 23 18      Treatments: IV hydration and Zofran, Reglan  Discharge Exam: Blood pressure 114/66, pulse 85, temperature 98.4 F (36.9 C), temperature source Oral, resp. rate 16, height 5\' 3"  (1.6 m), weight 110 lb (49.9 kg), last menstrual period 11/18/2014, SpO2 100 %, unknown if currently breastfeeding. General appearance: alert, cooperative and no distress GI: soft, non-tender; bowel sounds normal; no masses,  no organomegaly  Disposition: 01-Home or Self Care     Medication List    TAKE these medications   calcium carbonate 500 MG chewable tablet Commonly known as:  TUMS - dosed in mg elemental calcium Chew 1 tablet by mouth as needed for indigestion or heartburn.   hydrOXYzine 25 MG capsule Commonly known as:  VISTARIL Take 25 mg by mouth 3 (three) times daily as needed for anxiety.   ondansetron 8 MG disintegrating tablet Commonly known as:  ZOFRAN-ODT Take 8 mg by mouth every 8 (eight) hours as needed for nausea or vomiting.   pantoprazole 40 MG tablet Commonly known as:  PROTONIX Take 1 tablet (40 mg total) by mouth daily. What changed:  when to take this  reasons to take  this   prenatal multivitamin Tabs tablet Take 1 tablet by mouth at bedtime.   sertraline 50 MG tablet Commonly known as:  ZOLOFT Take 1 tablet (50 mg total) by mouth daily. What changed:  medication strength  how much to take  when to take this      Follow-up Information    [facility]   Specialty:  Obstetrics and Gynecology Contact information: [address] [place] [id number] [phone]          Signed: Scheryl DarterRNOLD,JAMES 05/01/2016, 7:01 AM

## 2016-05-05 ENCOUNTER — Telehealth: Payer: Self-pay | Admitting: Obstetrics & Gynecology

## 2016-05-05 NOTE — Telephone Encounter (Signed)
Called patient because we received a message that [facility] was no longer seeing her due to her Insurance being medicaid. I called her to make an appointment, and she wanted to know why. She stated [facility].

## 2016-05-15 ENCOUNTER — Inpatient Hospital Stay (HOSPITAL_COMMUNITY)
Admission: AD | Admit: 2016-05-15 | Discharge: 2016-05-17 | DRG: 781 | Disposition: A | Discharge status: Home / Self Care | Payer: Medicaid Other | Source: Ambulatory Visit | Attending: Obstetrics and Gynecology | Admitting: Obstetrics and Gynecology

## 2016-05-15 ENCOUNTER — Encounter (HOSPITAL_COMMUNITY): Payer: Self-pay

## 2016-05-15 ENCOUNTER — Inpatient Hospital Stay (EMERGENCY_DEPARTMENT_HOSPITAL)
Admission: AD | Admit: 2016-05-15 | Discharge: 2016-05-15 | Disposition: A | Discharge status: Home / Self Care | Payer: Medicaid Other | Source: Ambulatory Visit | Attending: Obstetrics and Gynecology | Admitting: Obstetrics and Gynecology

## 2016-05-15 DIAGNOSIS — F410 Panic disorder [episodic paroxysmal anxiety] without agoraphobia: Secondary | ICD-10-CM | POA: Diagnosis present

## 2016-05-15 DIAGNOSIS — R109 Unspecified abdominal pain: Secondary | ICD-10-CM | POA: Diagnosis present

## 2016-05-15 DIAGNOSIS — O26893 Other specified pregnancy related conditions, third trimester: Principal | ICD-10-CM | POA: Diagnosis present

## 2016-05-15 DIAGNOSIS — O99613 Diseases of the digestive system complicating pregnancy, third trimester: Secondary | ICD-10-CM | POA: Diagnosis present

## 2016-05-15 DIAGNOSIS — D72828 Other elevated white blood cell count: Secondary | ICD-10-CM | POA: Insufficient documentation

## 2016-05-15 DIAGNOSIS — K5900 Constipation, unspecified: Secondary | ICD-10-CM | POA: Diagnosis present

## 2016-05-15 DIAGNOSIS — K219 Gastro-esophageal reflux disease without esophagitis: Secondary | ICD-10-CM | POA: Diagnosis present

## 2016-05-15 DIAGNOSIS — O99343 Other mental disorders complicating pregnancy, third trimester: Secondary | ICD-10-CM | POA: Diagnosis present

## 2016-05-15 DIAGNOSIS — Z79899 Other long term (current) drug therapy: Secondary | ICD-10-CM | POA: Insufficient documentation

## 2016-05-15 DIAGNOSIS — Z3A34 34 weeks gestation of pregnancy: Secondary | ICD-10-CM

## 2016-05-15 DIAGNOSIS — O4703 False labor before 37 completed weeks of gestation, third trimester: Secondary | ICD-10-CM

## 2016-05-15 DIAGNOSIS — R112 Nausea with vomiting, unspecified: Secondary | ICD-10-CM | POA: Insufficient documentation

## 2016-05-15 DIAGNOSIS — O26899 Other specified pregnancy related conditions, unspecified trimester: Secondary | ICD-10-CM

## 2016-05-15 DIAGNOSIS — K5903 Drug induced constipation: Secondary | ICD-10-CM | POA: Diagnosis not present

## 2016-05-15 DIAGNOSIS — D72829 Elevated white blood cell count, unspecified: Secondary | ICD-10-CM | POA: Diagnosis present

## 2016-05-15 DIAGNOSIS — O219 Vomiting of pregnancy, unspecified: Secondary | ICD-10-CM

## 2016-05-15 DIAGNOSIS — O212 Late vomiting of pregnancy: Secondary | ICD-10-CM | POA: Diagnosis present

## 2016-05-15 DIAGNOSIS — O368390 Maternal care for abnormalities of the fetal heart rate or rhythm, unspecified trimester, not applicable or unspecified: Secondary | ICD-10-CM

## 2016-05-15 LAB — COMPREHENSIVE METABOLIC PANEL
ALBUMIN: 3.6 g/dL (ref 3.5–5.0)
ALK PHOS: 128 U/L — AB (ref 38–126)
ALT: 26 U/L (ref 14–54)
AST: 32 U/L (ref 15–41)
Anion gap: 13 (ref 5–15)
BILIRUBIN TOTAL: 1 mg/dL (ref 0.3–1.2)
BUN: 6 mg/dL (ref 6–20)
CALCIUM: 9.7 mg/dL (ref 8.9–10.3)
CO2: 20 mmol/L — ABNORMAL LOW (ref 22–32)
Chloride: 101 mmol/L (ref 101–111)
Creatinine, Ser: 0.5 mg/dL (ref 0.44–1.00)
GFR calc Af Amer: >60 mL/min (ref >60)
GFR calc non Af Amer: >60 mL/min (ref >60)
GLUCOSE: 110 mg/dL — AB (ref 65–99)
Potassium: 3.6 mmol/L (ref 3.5–5.1)
Sodium: 134 mmol/L — ABNORMAL LOW (ref 135–145)
TOTAL PROTEIN: 7 g/dL (ref 6.5–8.1)

## 2016-05-15 LAB — URINE MICROSCOPIC-ADD ON

## 2016-05-15 LAB — AMYLASE: Amylase: 89 U/L (ref 28–100)

## 2016-05-15 LAB — URINALYSIS, ROUTINE W REFLEX MICROSCOPIC
BILIRUBIN URINE: NEGATIVE
Glucose, UA: NEGATIVE mg/dL
HGB URINE DIPSTICK: NEGATIVE
Ketones, ur: >80 mg/dL — AB
NITRITE: NEGATIVE
PH: 6.5 (ref 5.0–8.0)
Protein, ur: NEGATIVE mg/dL
SPECIFIC GRAVITY, URINE: 1.025 (ref 1.005–1.030)

## 2016-05-15 LAB — PROTEIN / CREATININE RATIO, URINE
CREATININE, URINE: 100 mg/dL
PROTEIN CREATININE RATIO: 0.21 mg/mg{creat} — AB (ref 0.00–0.15)
TOTAL PROTEIN, URINE: 21 mg/dL

## 2016-05-15 LAB — DIFFERENTIAL
BASOS ABS: 0 10*3/uL (ref 0.0–0.1)
Basophils Relative: 0 %
EOS ABS: 0 10*3/uL (ref 0.0–0.7)
Eosinophils Relative: 0 %
LYMPHS ABS: 1.5 10*3/uL (ref 0.7–4.0)
Lymphocytes Relative: 9 %
Monocytes Absolute: 0.3 10*3/uL (ref 0.1–1.0)
Monocytes Relative: 2 %
NEUTROS PCT: 89 %
Neutro Abs: 14.4 10*3/uL — ABNORMAL HIGH (ref 1.7–7.7)

## 2016-05-15 LAB — CBC
HEMATOCRIT: 37.5 % (ref 36.0–46.0)
HEMOGLOBIN: 13.3 g/dL (ref 12.0–15.0)
MCH: 32.2 pg (ref 26.0–34.0)
MCHC: 35.5 g/dL (ref 30.0–36.0)
MCV: 90.8 fL (ref 78.0–100.0)
Platelets: 216 10*3/uL (ref 150–400)
RBC: 4.13 MIL/uL (ref 3.87–5.11)
RDW: 13.3 % (ref 11.5–15.5)
WBC: 16.5 10*3/uL — AB (ref 4.0–10.5)

## 2016-05-15 LAB — LIPASE, BLOOD: Lipase: 46 U/L (ref 11–51)

## 2016-05-15 MED ORDER — FENTANYL CITRATE (PF) 100 MCG/2ML IJ SOLN
50.0000 ug | Freq: Once | INTRAMUSCULAR | Status: AC
  Administered 2016-05-16: 50 ug via INTRAMUSCULAR
  Filled 2016-05-15: qty 2

## 2016-05-15 MED ORDER — FENTANYL CITRATE (PF) 100 MCG/2ML IJ SOLN
100.0000 ug | Freq: Once | INTRAMUSCULAR | Status: AC
  Administered 2016-05-15: 100 ug via INTRAVENOUS
  Filled 2016-05-15: qty 2

## 2016-05-15 MED ORDER — PROMETHAZINE HCL 25 MG PO TABS: 25.0000 mg | ORAL_TABLET | Freq: Four times a day (QID) | ORAL | 2 refills | Status: DC | PRN

## 2016-05-15 MED ORDER — ONDANSETRON HCL 4 MG/2ML IJ SOLN: 4.0000 mg | Freq: Once | INTRAMUSCULAR | Status: DC

## 2016-05-15 MED ORDER — POLYETHYLENE GLYCOL 3350 17 GM/SCOOP PO POWD: 17.0000 g | Freq: Every day | ORAL | 2 refills | Status: DC | PRN

## 2016-05-15 MED ORDER — LACTATED RINGERS IV SOLN
25.0000 mg | Freq: Once | INTRAVENOUS | Status: AC
  Administered 2016-05-15: 25 mg via INTRAVENOUS
  Filled 2016-05-15: qty 1

## 2016-05-15 NOTE — MAU Provider Note (Signed)
Chief Complaint:  Abdominal Pain and Nausea   First Provider Initiated Contact with Patient 05/15/16 1846     HPI: [name] is a 25 y.o. G2P0010 at 6923w3d who presents to maternity admissions reporting exacerbation of N/V since 6:00pm and abd pain that started yesterday, but worsened over the past few hours. Pt very distressed upon arrival, actively vomiting (although more saliva than emesis), shaking, unable to give much of a HPI. Was admitted 10/27-10/28 for intractable N/V. Has Hx of chronic N/V prior to pregnancy and had a pregnancy termination in 2016 due to the distress of N/V in pregnancy.   Location: Upper and lower abd  Radiation: None Quality: pressure Severity: Unable to verbalize Duration: few hours Course worsening Timing: Constant w/ intermittent exacerbations Modifying factors: None. Hasn't taken  Associated signs and symptoms: Pos for N/V, constipation. Used enema at ~1600 which was successful. Neg for fever, chills, VB, LOF. Good fetal movement.   Past Medical History:  Diagnosis Date  . Cyclic vomiting syndrome 01/05/2015   marijuana induced emesis  . Generalized anxiety disorder   . GERD (gastroesophageal reflux disease)   . Marijuana use    daily  . Panic attacks    OB History  Gravida Para Term Preterm AB Living  2       1 0  SAB TAB Ectopic Multiple Live Births    1          # Outcome Date GA Lbr Len/2nd Weight Sex Delivery Anes PTL Lv  2 Current           1 TAB 2016             Past Surgical History:  Procedure Laterality Date  . NO PAST SURGERIES     Family History  Problem Relation Age of Onset  . Alcohol abuse Neg Hx   . Arthritis Neg Hx   . Asthma Neg Hx   . Birth defects Neg Hx   . Cancer Neg Hx   . COPD Neg Hx   . Depression Neg Hx   . Diabetes Neg Hx   . Drug abuse Neg Hx   . Early death Neg Hx   . Hearing loss Neg Hx   . Heart disease Neg Hx   . Hyperlipidemia Neg Hx   . Hypertension Neg Hx   . Kidney disease Neg Hx   .  Learning disabilities Neg Hx   . Mental illness Neg Hx   . Mental retardation Neg Hx   . Miscarriages / Stillbirths Neg Hx   . Stroke Neg Hx   . Vision loss Neg Hx   . Varicose Veins Neg Hx    Social History  Substance Use Topics  . Smoking status: Never Smoker  . Smokeless tobacco: Never Used  . Alcohol use No   No Known Allergies Prescriptions Prior to Admission  Medication Sig Dispense Refill Last Dose  . calcium carbonate (TUMS - DOSED IN MG ELEMENTAL CALCIUM) 500 MG chewable tablet Chew 1 tablet by mouth as needed for indigestion or heartburn.    04/28/2016 at Unknown time  . hydrOXYzine (VISTARIL) 25 MG capsule Take 25 mg by mouth 3 (three) times daily as needed for anxiety.   04/28/2016 at Unknown time  . ondansetron (ZOFRAN-ODT) 8 MG disintegrating tablet Take 8 mg by mouth every 8 (eight) hours as needed for nausea or vomiting.   1 Past Month at Unknown time  . pantoprazole (PROTONIX) 40 MG tablet Take 1 tablet (40 mg  total) by mouth daily. 30 tablet 2   . Prenatal Vit-Fe Fumarate-FA (PRENATAL MULTIVITAMIN) TABS tablet Take 1 tablet by mouth at bedtime.    Past Week at Unknown time  . sertraline (ZOLOFT) 50 MG tablet Take 1 tablet (50 mg total) by mouth daily. 30 tablet 2     I have reviewed patient's Past Medical Hx, Surgical Hx, Family Hx, Social Hx, medications and allergies.   ROS:  Review of Systems  Constitutional: Negative for appetite change, chills and fever.  Gastrointestinal: Positive for abdominal pain, constipation, nausea and vomiting. Negative for abdominal distention and blood in stool.  Genitourinary: Negative for difficulty urinating, dysuria, flank pain, frequency, hematuria, urgency, vaginal bleeding and vaginal discharge.  Musculoskeletal: Negative for back pain.  Psychiatric/Behavioral: The patient is nervous/anxious.     Physical Exam   Patient Vitals for the past 24 hrs:  BP Temp Pulse Resp SpO2  05/15/16 1843 120/94 97.6 F (36.4 C) - 20 -   05/15/16 1842 - - 98 - 100 %   Constitutional: Well-developed, well-nourished female in severe distress--primarily emotional.  Cardiovascular: normal rate Respiratory: normal effort GI: Abd soft, non-tender, gravid appropriate for gestational age. Pos BS x 4. Contractions palpated.  MS: Extremities nontender, no edema, normal ROM Neurologic: Alert and oriented x 4.  GU: Neg CVAT.  Pelvic: NEFG, physiologic discharge, no blood, cervix clean. No CMT  Dilation: Closed Presentation: Vertex Exam by:: Ivonne Andrew CNM  FHT:  Baseline 135 , moderate variability, accelerations present, no decelerations Contractions: q 2-4 mins, mild-mod. Soft btw UC's   Labs: Results for orders placed or performed during the hospital encounter of 05/15/16 (from the past 24 hour(s))  CBC     Status: Abnormal   Collection Time: 05/15/16  7:17 PM  Result Value Ref Range   WBC 16.5 (H) 4.0 - 10.5 K/uL   RBC 4.13 3.87 - 5.11 MIL/uL   Hemoglobin 13.3 12.0 - 15.0 g/dL   HCT 16.1 09.6 - 04.5 %   MCV 90.8 78.0 - 100.0 fL   MCH 32.2 26.0 - 34.0 pg   MCHC 35.5 30.0 - 36.0 g/dL   RDW 40.9 81.1 - 91.4 %   Platelets 216 150 - 400 K/uL  Comprehensive metabolic panel     Status: Abnormal   Collection Time: 05/15/16  7:17 PM  Result Value Ref Range   Sodium 134 (L) 135 - 145 mmol/L   Potassium 3.6 3.5 - 5.1 mmol/L   Chloride 101 101 - 111 mmol/L   CO2 20 (L) 22 - 32 mmol/L   Glucose, Bld 110 (H) 65 - 99 mg/dL   BUN 6 6 - 20 mg/dL   Creatinine, Ser 7.82 0.44 - 1.00 mg/dL   Calcium 9.7 8.9 - 95.6 mg/dL   Total Protein 7.0 6.5 - 8.1 g/dL   Albumin 3.6 3.5 - 5.0 g/dL   AST 32 15 - 41 U/L   ALT 26 14 - 54 U/L   Alkaline Phosphatase 128 (H) 38 - 126 U/L   Total Bilirubin 1.0 0.3 - 1.2 mg/dL   GFR calc non Af Amer >60 >60 mL/min   GFR calc Af Amer >60 >60 mL/min   Anion gap 13 5 - 15  Lipase, blood     Status: None   Collection Time: 05/15/16  7:17 PM  Result Value Ref Range   Lipase 46 11 - 51 U/L  Amylase      Status: None   Collection Time: 05/15/16  7:17 PM  Result Value Ref Range   Amylase 89 28 - 100 U/L  Differential     Status: Abnormal   Collection Time: 05/15/16  7:17 PM  Result Value Ref Range   Neutrophils Relative % 89 %   Neutro Abs 14.4 (H) 1.7 - 7.7 K/uL   Lymphocytes Relative 9 %   Lymphs Abs 1.5 0.7 - 4.0 K/uL   Monocytes Relative 2 %   Monocytes Absolute 0.3 0.1 - 1.0 K/uL   Eosinophils Relative 0 %   Eosinophils Absolute 0.0 0.0 - 0.7 K/uL   Basophils Relative 0 %   Basophils Absolute 0.0 0.0 - 0.1 K/uL  Urinalysis, Routine w reflex microscopic (not at [facility])     Status: Abnormal   Collection Time: 05/15/16  8:00 PM  Result Value Ref Range   Color, Urine YELLOW YELLOW   APPearance CLEAR CLEAR   Specific Gravity, Urine 1.025 1.005 - 1.030   pH 6.5 5.0 - 8.0   Glucose, UA NEGATIVE NEGATIVE mg/dL   Hgb urine dipstick NEGATIVE NEGATIVE   Bilirubin Urine NEGATIVE NEGATIVE   Ketones, ur >80 (A) NEGATIVE mg/dL   Protein, ur NEGATIVE NEGATIVE mg/dL   Nitrite NEGATIVE NEGATIVE   Leukocytes, UA TRACE (A) NEGATIVE  Urine microscopic-add on     Status: Abnormal   Collection Time: 05/15/16  8:00 PM  Result Value Ref Range   Squamous Epithelial / LPF 0-5 (A) NONE SEEN   WBC, UA 0-5 0 - 5 WBC/hpf   RBC / HPF 0-5 0 - 5 RBC/hpf   Bacteria, UA RARE (A) NONE SEEN    Imaging:  NA  MAU Course: Orders Placed This Encounter  Procedures  . CBC  . Comprehensive metabolic panel  . Lipase, blood  . Amylase  . Urinalysis, Routine w reflex microscopic (not at [facility])  . Urine microscopic-add on  . Differential  . Insert peripheral IV  . Discharge patient   Meds ordered this encounter  Medications  . promethazine (PHENERGAN) 25 mg in lactated ringers 1,000 mL infusion  . fentaNYL (SUBLIMAZE) injection 100 mcg  . ondansetron (ZOFRAN) injection 4 mg  . promethazine (PHENERGAN) 25 MG tablet    Sig: Take 1 tablet (25 mg total) by mouth every 6 (six) hours as needed.    Dispense:   30 tablet    Refill:  2    Order Specific Question:   Supervising Provider    Answer:   Willodean Rosenthal [id number]  . polyethylene glycol powder (GLYCOLAX/MIRALAX) powder    Sig: Take 17 g by mouth daily as needed for moderate constipation.    Dispense:  500 g    Refill:  2    Order Specific Question:   Supervising Provider    Answer:   Willodean Rosenthal [[id number]]   Pt feeling completely better. Calm, conversational. Discussed Hx. Exam, interventions w/ Dr. Su Hilt. Add differential. May D/C home.   MDM: - Acute on chronic N/V possibly exacerbated by pain of contractions, anxiety. Resolved w/ Phenergan. Tolerating PO's.  - Preterm contractions w/out evidence of active preterm labor. Resolved w/ IV bolus 2 liters.  - Panic attack w/ known anxiety disorder. Resolved. Pt calm, reports feeling much better after interventions.  - Chronic stable leukocytosis. No evidence of infection.   Assessment: 1. Preterm uterine contractions in third trimester, antepartum   2. Nausea and vomiting of pregnancy, antepartum   3. Panic attack   4.      Chronic, stable leukocytosis  Plan: Discharge home in stable condition.  Preterm Labor precautions and fetal kick counts Rx phenergan Recommend stool softener. Stop or limit Zofran. Do not use enemas for remainder of pregnancy.  Follow-up Information    [facility] Follow up on 05/17/2016.   Specialty:  Obstetrics and Gynecology Why:  as scheduled for prenatal visit or sooner as needed if symptoms worsen. Contact information: [address] [place] [id number] [phone]        [facility] Follow up.   Why:  as needed in emergencies Contact information: [address] [id number] [place] [id number] [phone]            Medication List    TAKE these medications   calcium carbonate 500 MG chewable tablet Commonly known as:   TUMS - dosed in mg elemental calcium Chew 1 tablet by mouth as needed for indigestion or heartburn.   hydrOXYzine 25 MG capsule Commonly known as:  VISTARIL Take 25 mg by mouth 3 (three) times daily as needed for anxiety.   ondansetron 8 MG disintegrating tablet Commonly known as:  ZOFRAN-ODT Take 8 mg by mouth every 8 (eight) hours as needed for nausea or vomiting.   pantoprazole 40 MG tablet Commonly known as:  PROTONIX Take 1 tablet (40 mg total) by mouth daily.   polyethylene glycol powder powder Commonly known as:  GLYCOLAX/MIRALAX Take 17 g by mouth daily as needed for moderate constipation.   prenatal multivitamin Tabs tablet Take 1 tablet by mouth at bedtime.   promethazine 25 MG tablet Commonly known as:  PHENERGAN Take 1 tablet (25 mg total) by mouth every 6 (six) hours as needed.   sertraline 50 MG tablet Commonly known as:  ZOLOFT Take 1 tablet (50 mg total) by mouth daily.       [place] Makendra Vigeant, CNM 05/15/2016 8:42 PM

## 2016-05-15 NOTE — Discharge Instructions (Signed)
Braxton Hicks Contractions °Contractions of the uterus can occur throughout pregnancy. Contractions are not always a sign that you are in labor.  °WHAT ARE BRAXTON HICKS CONTRACTIONS?  °Contractions that occur before labor are called Braxton Hicks contractions, or false labor. Toward the end of pregnancy (32-34 weeks), these contractions can develop more often and may become more forceful. This is not true labor because these contractions do not result in opening (dilatation) and thinning of the cervix. They are sometimes difficult to tell apart from true labor because these contractions can be forceful and people have different pain tolerances. You should not feel embarrassed if you go to the hospital with false labor. Sometimes, the only way to tell if you are in true labor is for your health care provider to look for changes in the cervix. °If there are no prenatal problems or other health problems associated with the pregnancy, it is completely safe to be sent home with false labor and await the onset of true labor. °HOW CAN YOU TELL THE DIFFERENCE BETWEEN TRUE AND FALSE LABOR? °False Labor °· The contractions of false labor are usually shorter and not as hard as those of true labor.   °· The contractions are usually irregular.   °· The contractions are often felt in the front of the lower abdomen and in the groin.   °· The contractions may go away when you walk around or change positions while lying down.   °· The contractions get weaker and are shorter lasting as time goes on.   °· The contractions do not usually become progressively stronger, regular, and closer together as with true labor.   °True Labor °· Contractions in true labor last 30-70 seconds, become very regular, usually become more intense, and increase in frequency.   °· The contractions do not go away with walking.   °· The discomfort is usually felt in the top of the uterus and spreads to the lower abdomen and low back.   °· True labor can be  determined by your health care provider with an exam. This will show that the cervix is dilating and getting thinner.   °WHAT TO REMEMBER °· Keep up with your usual exercises and follow other instructions given by your health care provider.   °· Take medicines as directed by your health care provider.   °· Keep your regular prenatal appointments.   °· Eat and drink lightly if you think you are going into labor.   °· If Braxton Hicks contractions are making you uncomfortable:   °¨ Change your position from lying down or resting to walking, or from walking to resting.   °¨ Sit and rest in a tub of warm water.   °¨ Drink 2-3 glasses of water. Dehydration may cause these contractions.   °¨ Do slow and deep breathing several times an hour.   °WHEN SHOULD I SEEK IMMEDIATE MEDICAL CARE? °Seek immediate medical care if: °· Your contractions become stronger, more regular, and closer together.   °· You have fluid leaking or gushing from your vagina.   °· You have a fever.   °· You pass blood-tinged mucus.   °· You have vaginal bleeding.   °· You have continuous abdominal pain.   °· You have low back pain that you never had before.   °· You feel your baby's head pushing down and causing pelvic pressure.   °· Your baby is not moving as much as it used to.   °  °This information is not intended to replace advice given to you by your health care provider. Make sure you discuss any questions you have with your health care   provider. °  °Document Released: 06/20/2005 Document Revised: 06/25/2013 Document Reviewed: 04/01/2013 °Elsevier Interactive Patient Education ©2016 Elsevier Inc. ° °

## 2016-05-15 NOTE — MAU Note (Signed)
Pt presents stating that her contractions are worse and she is continuing to vomit.

## 2016-05-15 NOTE — MAU Note (Signed)
Pt feels like ctx are 10 minutes apart.

## 2016-05-15 NOTE — MAU Note (Signed)
3am, started having pain, and having nausea and vomiting.

## 2016-05-16 ENCOUNTER — Inpatient Hospital Stay (HOSPITAL_COMMUNITY): Payer: Medicaid Other

## 2016-05-16 DIAGNOSIS — O99343 Other mental disorders complicating pregnancy, third trimester: Secondary | ICD-10-CM | POA: Diagnosis present

## 2016-05-16 DIAGNOSIS — O4703 False labor before 37 completed weeks of gestation, third trimester: Secondary | ICD-10-CM | POA: Diagnosis not present

## 2016-05-16 DIAGNOSIS — R109 Unspecified abdominal pain: Secondary | ICD-10-CM

## 2016-05-16 DIAGNOSIS — O99613 Diseases of the digestive system complicating pregnancy, third trimester: Secondary | ICD-10-CM | POA: Diagnosis present

## 2016-05-16 DIAGNOSIS — D72829 Elevated white blood cell count, unspecified: Secondary | ICD-10-CM | POA: Diagnosis present

## 2016-05-16 DIAGNOSIS — O219 Vomiting of pregnancy, unspecified: Secondary | ICD-10-CM | POA: Diagnosis not present

## 2016-05-16 DIAGNOSIS — K5900 Constipation, unspecified: Secondary | ICD-10-CM | POA: Diagnosis present

## 2016-05-16 DIAGNOSIS — F410 Panic disorder [episodic paroxysmal anxiety] without agoraphobia: Secondary | ICD-10-CM | POA: Diagnosis not present

## 2016-05-16 DIAGNOSIS — K219 Gastro-esophageal reflux disease without esophagitis: Secondary | ICD-10-CM | POA: Diagnosis present

## 2016-05-16 DIAGNOSIS — K5903 Drug induced constipation: Secondary | ICD-10-CM | POA: Diagnosis not present

## 2016-05-16 DIAGNOSIS — O26893 Other specified pregnancy related conditions, third trimester: Secondary | ICD-10-CM | POA: Diagnosis not present

## 2016-05-16 DIAGNOSIS — O26899 Other specified pregnancy related conditions, unspecified trimester: Secondary | ICD-10-CM | POA: Diagnosis present

## 2016-05-16 DIAGNOSIS — O212 Late vomiting of pregnancy: Secondary | ICD-10-CM | POA: Diagnosis present

## 2016-05-16 DIAGNOSIS — Z3A34 34 weeks gestation of pregnancy: Secondary | ICD-10-CM | POA: Diagnosis not present

## 2016-05-16 LAB — RAPID URINE DRUG SCREEN, HOSP PERFORMED
Amphetamines: NOT DETECTED
BENZODIAZEPINES: NOT DETECTED
Barbiturates: NOT DETECTED
COCAINE: NOT DETECTED
OPIATES: NOT DETECTED
TETRAHYDROCANNABINOL: POSITIVE — AB

## 2016-05-16 LAB — TYPE AND SCREEN
ABO/RH(D): O POS
ANTIBODY SCREEN: NEGATIVE

## 2016-05-16 MED ORDER — TERBUTALINE SULFATE 1 MG/ML IJ SOLN
0.2500 mg | Freq: Once | INTRAMUSCULAR | Status: AC
  Administered 2016-05-16: 0.25 mg via SUBCUTANEOUS

## 2016-05-16 MED ORDER — FAMOTIDINE 20 MG PO TABS
20.0000 mg | ORAL_TABLET | Freq: Two times a day (BID) | ORAL | Status: DC
Start: 2016-05-16 — End: 2016-05-17
  Administered 2016-05-16 – 2016-05-17 (×3): 20 mg via ORAL
  Filled 2016-05-16 (×3): qty 1

## 2016-05-16 MED ORDER — TERBUTALINE SULFATE 1 MG/ML IJ SOLN
INTRAMUSCULAR | Status: AC
  Filled 2016-05-16: qty 1

## 2016-05-16 MED ORDER — PANTOPRAZOLE SODIUM 40 MG PO TBEC
40.0000 mg | DELAYED_RELEASE_TABLET | ORAL | Status: AC
  Administered 2016-05-16: 40 mg via ORAL
  Filled 2016-05-16: qty 1

## 2016-05-16 MED ORDER — PANTOPRAZOLE SODIUM 40 MG PO TBEC
40.0000 mg | DELAYED_RELEASE_TABLET | Freq: Every day | ORAL | Status: DC
  Administered 2016-05-16: 40 mg via ORAL
  Filled 2016-05-16: qty 1

## 2016-05-16 MED ORDER — ONDANSETRON HCL 4 MG/2ML IJ SOLN
4.0000 mg | Freq: Four times a day (QID) | INTRAMUSCULAR | Status: DC
  Administered 2016-05-16 – 2016-05-17 (×4): 4 mg via INTRAVENOUS
  Filled 2016-05-16 (×4): qty 2

## 2016-05-16 MED ORDER — BETAMETHASONE SOD PHOS & ACET 6 (3-3) MG/ML IJ SUSP
12.0000 mg | Freq: Every day | INTRAMUSCULAR | Status: AC
  Administered 2016-05-16 – 2016-05-17 (×2): 12 mg via INTRAMUSCULAR
  Filled 2016-05-16 (×2): qty 2

## 2016-05-16 MED ORDER — NIFEDIPINE 10 MG PO CAPS
10.0000 mg | ORAL_CAPSULE | ORAL | Status: AC | PRN
  Administered 2016-05-16 (×4): 10 mg via ORAL
  Filled 2016-05-16 (×4): qty 1

## 2016-05-16 MED ORDER — ZOLPIDEM TARTRATE 5 MG PO TABS: 5.0000 mg | ORAL_TABLET | Freq: Every evening | ORAL | Status: DC | PRN

## 2016-05-16 MED ORDER — ACETAMINOPHEN 325 MG PO TABS: 650.0000 mg | ORAL_TABLET | ORAL | Status: DC | PRN

## 2016-05-16 MED ORDER — CALCIUM CARBONATE ANTACID 500 MG PO CHEW
2.0000 | CHEWABLE_TABLET | ORAL | Status: DC | PRN
  Administered 2016-05-16: 400 mg via ORAL
  Filled 2016-05-16: qty 2

## 2016-05-16 MED ORDER — CALCIUM CARBONATE ANTACID 500 MG PO CHEW
2.0000 | CHEWABLE_TABLET | Freq: Once | ORAL | Status: AC
  Administered 2016-05-16: 400 mg via ORAL
  Filled 2016-05-16: qty 2

## 2016-05-16 MED ORDER — NIFEDIPINE 10 MG PO CAPS
20.0000 mg | ORAL_CAPSULE | ORAL | Status: DC | PRN
  Administered 2016-05-16 – 2016-05-17 (×2): 20 mg via ORAL
  Filled 2016-05-16 (×2): qty 2

## 2016-05-16 MED ORDER — LACTATED RINGERS IV BOLUS (SEPSIS)
1000.0000 mL | Freq: Once | INTRAVENOUS | Status: AC
  Administered 2016-05-16: 1000 mL via INTRAVENOUS

## 2016-05-16 MED ORDER — PRENATAL MULTIVITAMIN CH
1.0000 | ORAL_TABLET | Freq: Every day | ORAL | Status: DC
  Administered 2016-05-16: 1 via ORAL
  Filled 2016-05-16 (×2): qty 1

## 2016-05-16 MED ORDER — SERTRALINE HCL 50 MG PO TABS
50.0000 mg | ORAL_TABLET | Freq: Every day | ORAL | Status: DC
  Administered 2016-05-16: 50 mg via ORAL
  Filled 2016-05-16 (×2): qty 1

## 2016-05-16 MED ORDER — PROMETHAZINE HCL 25 MG/ML IJ SOLN
25.0000 mg | Freq: Three times a day (TID) | INTRAMUSCULAR | Status: DC | PRN
  Administered 2016-05-16: 25 mg via INTRAVENOUS
  Filled 2016-05-16: qty 1

## 2016-05-16 MED ORDER — ONDANSETRON HCL 4 MG/2ML IJ SOLN
4.0000 mg | Freq: Once | INTRAMUSCULAR | Status: AC
  Administered 2016-05-16: 4 mg via INTRAVENOUS
  Filled 2016-05-16: qty 2

## 2016-05-16 MED ORDER — LACTATED RINGERS IV SOLN
INTRAVENOUS | Status: DC
  Administered 2016-05-16 – 2016-05-17 (×6): via INTRAVENOUS

## 2016-05-16 MED ORDER — SERTRALINE HCL 50 MG PO TABS
50.0000 mg | ORAL_TABLET | ORAL | Status: AC
  Administered 2016-05-16: 50 mg via ORAL
  Filled 2016-05-16: qty 1

## 2016-05-16 MED ORDER — HYDROXYZINE HCL 25 MG PO TABS
25.0000 mg | ORAL_TABLET | Freq: Three times a day (TID) | ORAL | Status: DC | PRN
  Administered 2016-05-16: 25 mg via ORAL
  Filled 2016-05-16 (×3): qty 1

## 2016-05-16 MED ORDER — DOCUSATE SODIUM 100 MG PO CAPS
100.0000 mg | ORAL_CAPSULE | Freq: Every day | ORAL | Status: DC
  Administered 2016-05-16 – 2016-05-17 (×2): 100 mg via ORAL
  Filled 2016-05-16 (×3): qty 1

## 2016-05-16 NOTE — Progress Notes (Signed)
  Leotis PainSwaringen, Esterlene Female, 25 y.o., [birth year]  Subjective: Patient reports feeling better after recent procardia use for contractions and pepcid for reflux.  Phenergan did not improve nausea/vomiting.  Ambulating and voiding without difficulty.  Denies abdominal pain, vaginal bleeding or leakage of fluid.  With normal gross fetal movement.   Objective: I have reviewed patient's vital signs and medications. Vitals:   05/16/16 0250 05/16/16 0845 05/16/16 0846 05/16/16 1043  BP: 128/70  (!) 111/59 121/69  Pulse: 93     Resp: 18 16    Temp: 98.4 F (36.9 C) 98.4 F (36.9 C)    TempSrc: Oral     SpO2:      Weight: 49.9 kg (110 lb)     Height: 5\' 3"  (1.6 m)      General: alert, cooperative and no distress Resp: clear to auscultation bilaterally Cardio: regular rate and rhythm, S1, S2 normal, no murmur, click, rub or gallop GI: soft, non-tender; bowel sounds normal; no masses,  no organomegaly Extremities: extremities normal, atraumatic, no cyanosis or edema  EFM:  135 BL, mod. Variability, reactive TOCO: CTX Q 10 minutes CVX: Fingertip/thick/-3.   Ultrasound 05/16/16: Cephalic presentation.  BPP 8/8.  Anterior placenta.    . betamethasone acetate-betamethasone sodium phosphate  12 mg Intramuscular Q0200  . docusate sodium  100 mg Oral Daily  . famotidine  20 mg Oral BID  . ondansetron (ZOFRAN) IV  4 mg Intravenous Q6H  . pantoprazole  40 mg Oral QHS  . prenatal multivitamin  1 tablet Oral Q1200  . sertraline  50 mg Oral QHS    Assessment/Plan: 25 y/o G2P0010 @ 34 weeks 4 days EGA admitted for nausea, vomiting and abdominal pain, with chronic history of nausea and vomiting (even before pregnancy, marijuana substance abuse), GERD and preterm contractions noted during admission, s/p one  Episode of fetal prolonged deceleration at admission, with stable maternal and fetal status, hx of anxiety, panic attacks  -s/p procardia oral loading dose, may continue procardia 20 mg PO Q 4  hrs prn contractions, hold procardia for BP< or equal to 90/50  -For second betamethasone injection for fetal lung maturity tomorrow at 0200 -c/w antiemetics, GERD medication, tocolysis prn -Consider discharging to home tomorrow if with stable maternal and fetal status -Scheduled for office visit tomorrow with EFW/AFI Ultrasound, will need to reschedule for 1 week follow up at office after hospital discharge.    LOS: 0 days    Konrad FelixKULWA,Reid Nawrot WAKURU, MD 05/16/2016, 2:08 PM

## 2016-05-16 NOTE — H&P (Addendum)
Chief Complaint:  Abdominal Pain and Nausea   First Provider Initiated Contact with Patient 05/15/16 1846     HPI: Shadasia Oldfield is a 25 y.o. G2P0010 at [redacted]w[redacted]d who presents to maternity admissions reporting exacerbation of N/V since 6:00pm and abd pain that started yesterday, but worsened over the past few hours. Pt very distressed upon arrival, actively vomiting (although more saliva than emesis), shaking, unable to give much of a HPI. Was admitted 10/27-10/28 for intractable N/V. Has Hx of chronic N/V prior to pregnancy and had a pregnancy termination in 2016 due to the distress of N/V in pregnancy.   Location: Upper and lower abd  Radiation: None Quality: pressure Severity: Unable to verbalize Duration: few hours Course worsening Timing: Constant w/ intermittent exacerbations Modifying factors: None. Hasn't taken  Associated signs and symptoms: Pos for N/V, constipation. Used enema at ~1600 which was successful. Neg for fever, chills, VB, LOF. Good fetal movement.       Past Medical History:  Diagnosis Date  . Cyclic vomiting syndrome 01/05/2015   marijuana induced emesis  . Generalized anxiety disorder   . GERD (gastroesophageal reflux disease)   . Marijuana use    daily  . Panic attacks                    OB History  Gravida Para Term Preterm AB Living  2       1 0  SAB TAB Ectopic Multiple Live Births    1          # Outcome Date GA Lbr Len/2nd Weight Sex Delivery Anes PTL Lv  2 Current           1 TAB 2016                  Past Surgical History:  Procedure Laterality Date  . NO PAST SURGERIES          Family History  Problem Relation Age of Onset  . Alcohol abuse Neg Hx   . Arthritis Neg Hx   . Asthma Neg Hx   . Birth defects Neg Hx   . Cancer Neg Hx   . COPD Neg Hx   . Depression Neg Hx   . Diabetes Neg Hx   . Drug abuse Neg Hx   . Early death Neg Hx   . Hearing loss Neg Hx   . Heart disease Neg Hx   .  Hyperlipidemia Neg Hx   . Hypertension Neg Hx   . Kidney disease Neg Hx   . Learning disabilities Neg Hx   . Mental illness Neg Hx   . Mental retardation Neg Hx   . Miscarriages / Stillbirths Neg Hx   . Stroke Neg Hx   . Vision loss Neg Hx   . Varicose Veins Neg Hx        Social History  Substance Use Topics  . Smoking status: Never Smoker  . Smokeless tobacco: Never Used  . Alcohol use No   No Known Allergies        Prescriptions Prior to Admission  Medication Sig Dispense Refill Last Dose  . calcium carbonate (TUMS - DOSED IN MG ELEMENTAL CALCIUM) 500 MG chewable tablet Chew 1 tablet by mouth as needed for indigestion or heartburn.    04/28/2016 at Unknown time  . hydrOXYzine (VISTARIL) 25 MG capsule Take 25 mg by mouth 3 (three) times daily as needed for anxiety.   04/28/2016 at Unknown time  .  ondansetron (ZOFRAN-ODT) 8 MG disintegrating tablet Take 8 mg by mouth every 8 (eight) hours as needed for nausea or vomiting.   1 Past Month at Unknown time  . pantoprazole (PROTONIX) 40 MG tablet Take 1 tablet (40 mg total) by mouth daily. 30 tablet 2   . Prenatal Vit-Fe Fumarate-FA (PRENATAL MULTIVITAMIN) TABS tablet Take 1 tablet by mouth at bedtime.    Past Week at Unknown time  . sertraline (ZOLOFT) 50 MG tablet Take 1 tablet (50 mg total) by mouth daily. 30 tablet 2     I have reviewed patient's Past Medical Hx, Surgical Hx, Family Hx, Social Hx, medications and allergies.   ROS:  Review of Systems  Constitutional: Negative for appetite change, chills and fever.  Gastrointestinal: Positive for abdominal pain, constipation, nausea and vomiting. Negative for abdominal distention and blood in stool.  Genitourinary: Negative for difficulty urinating, dysuria, flank pain, frequency, hematuria, urgency, vaginal bleeding and vaginal discharge.  Musculoskeletal: Negative for back pain.  Psychiatric/Behavioral: The patient is nervous/anxious.     Physical  Exam   Patient Vitals for the past 24 hrs:  BP Temp Pulse Resp SpO2  05/15/16 1843 120/94 97.6 F (36.4 C) - 20 -  05/15/16 1842 - - 98 - 100 %   Constitutional: Well-developed, well-nourished female in severe distress--primarily emotional.  Cardiovascular: normal rate Respiratory: normal effort GI: Abd soft, non-tender, gravid appropriate for gestational age. Pos BS x 4. Contractions palpated.  MS: Extremities nontender, no edema, normal ROM Neurologic: Alert and oriented x 4.  GU: Neg CVAT.             Pelvic: NEFG, physiologic discharge, no blood, cervix clean. No CMT             Dilation: Closed Presentation: Vertex Exam by:: Ivonne AndrewV. Smith CNM  FHT:  Baseline 135 , moderate variability, accelerations present, no decelerations Contractions: q 2-4 mins, mild-mod. Soft btw UC's   Labs:      Results for orders placed or performed during the hospital encounter of 05/15/16 (from the past 24 hour(s))  CBC     Status: Abnormal   Collection Time: 05/15/16  7:17 PM  Result Value Ref Range   WBC 16.5 (H) 4.0 - 10.5 K/uL   RBC 4.13 3.87 - 5.11 MIL/uL   Hemoglobin 13.3 12.0 - 15.0 g/dL   HCT 16.137.5 09.636.0 - 04.546.0 %   MCV 90.8 78.0 - 100.0 fL   MCH 32.2 26.0 - 34.0 pg   MCHC 35.5 30.0 - 36.0 g/dL   RDW 40.913.3 81.111.5 - 91.415.5 %   Platelets 216 150 - 400 K/uL  Comprehensive metabolic panel     Status: Abnormal   Collection Time: 05/15/16  7:17 PM  Result Value Ref Range   Sodium 134 (L) 135 - 145 mmol/L   Potassium 3.6 3.5 - 5.1 mmol/L   Chloride 101 101 - 111 mmol/L   CO2 20 (L) 22 - 32 mmol/L   Glucose, Bld 110 (H) 65 - 99 mg/dL   BUN 6 6 - 20 mg/dL   Creatinine, Ser 7.820.50 0.44 - 1.00 mg/dL   Calcium 9.7 8.9 - 95.610.3 mg/dL   Total Protein 7.0 6.5 - 8.1 g/dL   Albumin 3.6 3.5 - 5.0 g/dL   AST 32 15 - 41 U/L   ALT 26 14 - 54 U/L   Alkaline Phosphatase 128 (H) 38 - 126 U/L   Total Bilirubin 1.0 0.3 - 1.2 mg/dL   GFR calc non Af Amer >  60 >60 mL/min   GFR calc Af  Amer >60 >60 mL/min   Anion gap 13 5 - 15  Lipase, blood     Status: None   Collection Time: 05/15/16  7:17 PM  Result Value Ref Range   Lipase 46 11 - 51 U/L  Amylase     Status: None   Collection Time: 05/15/16  7:17 PM  Result Value Ref Range   Amylase 89 28 - 100 U/L  Differential     Status: Abnormal   Collection Time: 05/15/16  7:17 PM  Result Value Ref Range   Neutrophils Relative % 89 %   Neutro Abs 14.4 (H) 1.7 - 7.7 K/uL   Lymphocytes Relative 9 %   Lymphs Abs 1.5 0.7 - 4.0 K/uL   Monocytes Relative 2 %   Monocytes Absolute 0.3 0.1 - 1.0 K/uL   Eosinophils Relative 0 %   Eosinophils Absolute 0.0 0.0 - 0.7 K/uL   Basophils Relative 0 %   Basophils Absolute 0.0 0.0 - 0.1 K/uL  Urinalysis, Routine w reflex microscopic (not at [facility])     Status: Abnormal   Collection Time: 05/15/16  8:00 PM  Result Value Ref Range   Color, Urine YELLOW YELLOW   APPearance CLEAR CLEAR   Specific Gravity, Urine 1.025 1.005 - 1.030   pH 6.5 5.0 - 8.0   Glucose, UA NEGATIVE NEGATIVE mg/dL   Hgb urine dipstick NEGATIVE NEGATIVE   Bilirubin Urine NEGATIVE NEGATIVE   Ketones, ur >80 (A) NEGATIVE mg/dL   Protein, ur NEGATIVE NEGATIVE mg/dL   Nitrite NEGATIVE NEGATIVE   Leukocytes, UA TRACE (A) NEGATIVE  Urine microscopic-add on     Status: Abnormal   Collection Time: 05/15/16  8:00 PM  Result Value Ref Range   Squamous Epithelial / LPF 0-5 (A) NONE SEEN   WBC, UA 0-5 0 - 5 WBC/hpf   RBC / HPF 0-5 0 - 5 RBC/hpf   Bacteria, UA RARE (A) NONE SEEN    Imaging:  NA  MAU Course:    Orders Placed This Encounter  Procedures  . CBC  . Comprehensive metabolic panel  . Lipase, blood  . Amylase  . Urinalysis, Routine w reflex microscopic ([facility])  . Urine microscopic-add on  . Differential  . Insert peripheral IV  . Discharge patient        Meds ordered this encounter  Medications  . promethazine (PHENERGAN) 25 mg in lactated ringers 1,000 mL  infusion  . fentaNYL (SUBLIMAZE) injection 100 mcg  . ondansetron (ZOFRAN) injection 4 mg  . promethazine (PHENERGAN) 25 MG tablet    Sig: Take 1 tablet (25 mg total) by mouth every 6 (six) hours as needed.    Dispense:  30 tablet    Refill:  2    Order Specific Question:   Supervising Provider    Answer:   Willodean RosenthalHARRAWAY-SMITH, CAROLYN [id number]]  . polyethylene glycol powder (GLYCOLAX/MIRALAX) powder    Sig: Take 17 g by mouth daily as needed for moderate constipation.    Dispense:  500 g    Refill:  2    Order Specific Question:   Supervising Provider    Answer:   Willodean RosenthalHARRAWAY-SMITH, CAROLYN [[id number]]   Pt feeling completely better. Calm, conversational. Discussed Hx. Exam, interventions w/ Dr. Su Hiltoberts. Add differential. May D/C home.   MDM: - Acute on chronic N/V possibly exacerbated by pain of contractions, anxiety. Resolved w/ Phenergan. Tolerating PO's.  - Preterm contractions w/out evidence of active preterm  labor. Resolved w/ IV bolus 2 liters.  - Panic attack w/ known anxiety disorder. Resolved. Pt calm, reports feeling much better after interventions.  - Chronic stable leukocytosis. No evidence of infection.   Assessment: 1. Preterm uterine contractions in third trimester, antepartum   2. Nausea and vomiting of pregnancy, antepartum   3. Panic attack   4.      Chronic, stable leukocytosis  Plan: Discharge home in stable condition.  Preterm Labor precautions and fetal kick counts Rx phenergan Recommend stool softener. Stop or limit Zofran. Do not use enemas for remainder of pregnancy.   Addendum:  The patient represented to MAU last night around 10:30am and was found to be contracting q2-46min. While in the MAU the patient was noted to have a prolonged deceleration to the 90s.  She had some n/v around this time but the staff reports the deceleration starting even before then.  Dr. Erin Fulling was asked for consultation by Rochele Pages, CNM at this time until my  arrival. Fetal heart rate had returned to baseline upon my arrival.  Decision made to admit patient and order u/s.  Will also give BMZ.  The patient had mentioned phenergan helped nausea previously.

## 2016-05-16 NOTE — MAU Provider Note (Signed)
History   [id number]   Chief Complaint  Patient presents with  . Contractions    HPI [name] is a 25 y.o. female  G2P0010 at 6376w4d IUP here with report of increased frequency of contractions.  Reports contractions occurring every 10 minutes.  Denies vaginal bleeding or leaking of fluid.   Recently discharged home at 2100 today.  Seen for increased nausea and vomiting and contractions.  Patient was given Fentanyl and phenergan IV, IV fluids and discharged home.  Cervix was closed at discharge.    Patient's last menstrual period was 11/18/2014.  OB History  Gravida Para Term Preterm AB Living  2       1 0  SAB TAB Ectopic Multiple Live Births    1          # Outcome Date GA Lbr Len/2nd Weight Sex Delivery Anes PTL Lv  2 Current           1 TAB 2016              Past Medical History:  Diagnosis Date  . Cyclic vomiting syndrome 01/05/2015   marijuana induced emesis  . Generalized anxiety disorder   . GERD (gastroesophageal reflux disease)   . Marijuana use    daily  . Panic attacks     Family History  Problem Relation Age of Onset  . Alcohol abuse Neg Hx   . Arthritis Neg Hx   . Asthma Neg Hx   . Birth defects Neg Hx   . Cancer Neg Hx   . COPD Neg Hx   . Depression Neg Hx   . Diabetes Neg Hx   . Drug abuse Neg Hx   . Early death Neg Hx   . Hearing loss Neg Hx   . Heart disease Neg Hx   . Hyperlipidemia Neg Hx   . Hypertension Neg Hx   . Kidney disease Neg Hx   . Learning disabilities Neg Hx   . Mental illness Neg Hx   . Mental retardation Neg Hx   . Miscarriages / Stillbirths Neg Hx   . Stroke Neg Hx   . Vision loss Neg Hx   . Varicose Veins Neg Hx     Social History   Social History  . Marital status: Single    Spouse name: N/A  . Number of children: N/A  . Years of education: N/A   Social History Main Topics  . Smoking status: Never Smoker  . Smokeless tobacco: Never Used  . Alcohol use No  . Drug use:     Types: Marijuana     Comment:  not currently  . Sexual activity: Yes     Comment: Last use 01/09/15   Other Topics Concern  . None   Social History Narrative  . None    No Known Allergies  No current facility-administered medications on file prior to encounter.    Current Outpatient Prescriptions on File Prior to Encounter  Medication Sig Dispense Refill  . calcium carbonate (TUMS - DOSED IN MG ELEMENTAL CALCIUM) 500 MG chewable tablet Chew 1 tablet by mouth as needed for indigestion or heartburn.     . hydrOXYzine (VISTARIL) 25 MG capsule Take 25 mg by mouth 3 (three) times daily as needed for anxiety.    . ondansetron (ZOFRAN-ODT) 8 MG disintegrating tablet Take 8 mg by mouth every 8 (eight) hours as needed for nausea or vomiting.   1  . pantoprazole (PROTONIX) 40 MG tablet Take 1  tablet (40 mg total) by mouth daily. 30 tablet 2  . polyethylene glycol powder (GLYCOLAX/MIRALAX) powder Take 17 g by mouth daily as needed for moderate constipation. 500 g 2  . Prenatal Vit-Fe Fumarate-FA (PRENATAL MULTIVITAMIN) TABS tablet Take 1 tablet by mouth at bedtime.     . promethazine (PHENERGAN) 25 MG tablet Take 1 tablet (25 mg total) by mouth every 6 (six) hours as needed. 30 tablet 2  . sertraline (ZOLOFT) 50 MG tablet Take 1 tablet (50 mg total) by mouth daily. 30 tablet 2     Review of Systems  Constitutional: Negative for chills and fever.  Eyes: Negative for visual disturbance.  Gastrointestinal: Negative for abdominal pain, diarrhea, nausea and vomiting.  Genitourinary: Positive for pelvic pain (contractions). Negative for flank pain, vaginal bleeding and vaginal pain.  Musculoskeletal: Negative for back pain.  Neurological: Negative for headaches.  Psychiatric/Behavioral: Positive for agitation (anxiety).  All other systems reviewed and are negative.    Physical Exam   Vitals:   05/15/16 2343 05/15/16 2346 05/15/16 2356 05/16/16 0000  BP: 137/83 140/84  129/77  Pulse: 99 95  84  SpO2:   100%      Physical Exam  Constitutional: She is oriented to person, place, and time. She appears well-developed and well-nourished.  HENT:  Head: Normocephalic.  Neck: Normal range of motion. Neck supple.  Cardiovascular: Normal rate, regular rhythm and normal heart sounds.   Respiratory: Effort normal and breath sounds normal. No respiratory distress.  GI: Soft. There is no tenderness.  Genitourinary: No bleeding in the vagina. Vaginal discharge (mucusy) found.  Musculoskeletal: Normal range of motion. She exhibits no edema.  Neurological: She is alert and oriented to person, place, and time.  Skin: Skin is warm and dry.   Dilation: Closed Exam by:: Margarita MailW Karim CNM   MAU Course  Procedures  MDM Results for orders placed or performed during the hospital encounter of 05/15/16 (from the past 24 hour(s))  Protein / creatinine ratio, urine     Status: Abnormal   Collection Time: 05/15/16  8:00 PM  Result Value Ref Range   Creatinine, Urine 100.00 mg/dL   Total Protein, Urine 21 mg/dL   Protein Creatinine Ratio 0.21 (H) 0.00 - 0.15 mg/mg[Cre]   2355 Dr. Su Hiltoberts called and given report; reviewed HPI/exam > obtain PR:CR ratio and offer IM pain meds. 0055 Pt reports improvement in pain; while in room noted fetal heart rate decel to 90's, mod variability.  Pt repositioned and Dr. Su Hiltoberts notified at 0103, reports coming to hospital.  Dr. Erin FullingHarraway-Smith notified and at bedside by 0107.  FHR increases to 120's at 0110 with Dr. Su Hiltoberts arriving and assuming care of patient.  Assessment and Plan  25 y.o. G2P0010 at 9372w4d IUP  Preterm Contractions  [name], CNM 05/16/2016 0111 AM

## 2016-05-17 MED ORDER — NIFEDIPINE 10 MG PO CAPS: 10.0000 mg | ORAL_CAPSULE | ORAL | 0 refills | Status: DC | PRN

## 2016-05-17 MED ORDER — NIFEDIPINE 10 MG PO CAPS
10.0000 mg | ORAL_CAPSULE | Freq: Once | ORAL | Status: AC
  Administered 2016-05-17: 10 mg via ORAL
  Filled 2016-05-17: qty 1

## 2016-05-17 MED ORDER — LACTATED RINGERS IV BOLUS (SEPSIS)
500.0000 mL | Freq: Once | INTRAVENOUS | Status: AC
Start: 2016-05-17 — End: 2016-05-17
  Administered 2016-05-17: 500 mL via INTRAVENOUS

## 2016-05-17 NOTE — Discharge Summary (Signed)
Physician Discharge Summary  Patient ID: [name] MRN: [id number] DOB/AGE: 1991-03-28 25 y.o.  Admit date: 05/15/2016 Discharge date: 05/17/2016  Admission Diagnoses: 34 3/7wks  N/V Abdominal Pain  Discharge Diagnoses:  Active Problems: N/V Abdominal pain affecting pregnancy   Discharged Condition: good  Hospital Course: Patient was admitted after presenting twice within 24hrs c/o N/V/abdominal pain.  She was found to be contracting and in the MAU had a prolonged deceleration.  She had a subsequent BPP 8/8 and cat 1 tracing the remainder of her admission.  Procardia seemed to help with contractions and comfort measures secondary to the abdominal pain that resulted.  There was no LOF, VB and good FM was reported throughout.  N/V and reflux was controlled with meds which included zofran and phenergan and protonix and pepcid.  Patient received a course of BMZ during admission.  Her urine was + for MJ and I discussed discontinuing with patient and she agreed to try.  Consults: None  Significant Diagnostic Studies: ultrasound  Treatments: IV hydration and procardia and betamethasone  Discharge Exam: Blood pressure 117/68, pulse (!) 106, temperature 98 F (36.7 C), temperature source Oral, resp. rate 16, height 5\' 3"  (1.6 m), weight 110 lb (49.9 kg), last menstrual period 11/18/2014, SpO2 100 %, unknown if currently breastfeeding. General appearance: alert and no distress Resp: clear to auscultation bilaterally Cardio: regular rate and rhythm GI: gravid, NT Extremities: no calf tenderness  Disposition: 01-Home or Self Care     Medication List    TAKE these medications   calcium carbonate 750 MG chewable tablet Commonly known as:  TUMS EX Chew 1 tablet by mouth as needed for heartburn.   hydrOXYzine 25 MG capsule Commonly known as:  VISTARIL Take 25 mg by mouth 3 (three) times daily as needed for anxiety.   NIFEdipine 10 MG capsule Commonly known as:   PROCARDIA Take 1 capsule (10 mg total) by mouth every 4 (four) hours as needed.   ondansetron 8 MG disintegrating tablet Commonly known as:  ZOFRAN-ODT Take 8 mg by mouth every 8 (eight) hours as needed for nausea or vomiting.   pantoprazole 40 MG tablet Commonly known as:  PROTONIX Take 40 mg by mouth at bedtime.   polyethylene glycol packet Commonly known as:  MIRALAX / GLYCOLAX Take 17 g by mouth daily as needed for mild constipation or moderate constipation.   prenatal multivitamin Tabs tablet Take 1 tablet by mouth at bedtime.   promethazine 25 MG tablet Commonly known as:  PHENERGAN Take 25 mg by mouth every 6 (six) hours as needed for nausea or vomiting.   sertraline 50 MG tablet Commonly known as:  ZOLOFT Take 50 mg by mouth at bedtime.      Follow-up Information    [facility] on 05/23/2016.   Specialty:  Obstetrics and Gynecology Why:  ultrasound scheduled at 9:45 am and appointment with Dr. Sallye OberKulwa at 10:15am Contact information: [address] [place] [phone]          Signed: Purcell NailsROBERTS,Abiola Behring Y 05/17/2016, 1:34 PM

## 2016-05-17 NOTE — Progress Notes (Signed)
Discharge instructions reviewed with patient. Patient verbalized an understanding of discharge instructions.

## 2016-05-17 NOTE — Progress Notes (Addendum)
Overnight strip review  S: Pt sleeping. Nurse reports ctxs improve when pt empties her bladder. Tolerated regular diet last evening.  O:  Today's Vitals   05/16/16 1957 05/16/16 2000 05/16/16 2300 05/17/16 0300  BP:  111/74  116/60  Pulse:  97  84  Resp: 18 18  16   Temp: 98.7 F (37.1 C)   97.7 F (36.5 C)  TempSrc: Oral   Oral  SpO2:      Weight:      Height:      PainSc:  0-No pain 0-No pain 0-No pain   FHT: BL 140 initially, then change in baseline to 130, 120, 110, then back to 120. Moderate variability, abundant accels, few mild variables, no obvious lates  Toco: Uterine irritability w/ most notable ctxs from 4:30 am to present  A: 25 y/o G2P0010 @ 34 weeks 5 days EGA admitted for nausea, vomiting, abdominal pain, preterm contractions, and episode of fetal prolonged deceleration  Overall reassuring FHRT  P: Pt to empty her bladder Procardia protocol 500 ml LR bolus   Sherre ScarletKimberly Ja Pistole, CNM 05/17/16, 6:21 AM

## 2016-05-18 ENCOUNTER — Telehealth (HOSPITAL_COMMUNITY): Payer: Self-pay | Admitting: *Deleted

## 2016-05-24 ENCOUNTER — Observation Stay (HOSPITAL_COMMUNITY)
Admission: AD | Admit: 2016-05-24 | Discharge: 2016-05-25 | Disposition: A | Discharge status: Home / Self Care | Payer: Medicaid Other | Source: Ambulatory Visit | Attending: Obstetrics & Gynecology | Admitting: Obstetrics & Gynecology

## 2016-05-24 ENCOUNTER — Observation Stay (HOSPITAL_COMMUNITY): Payer: Medicaid Other

## 2016-05-24 ENCOUNTER — Encounter (HOSPITAL_COMMUNITY): Payer: Self-pay | Admitting: *Deleted

## 2016-05-24 DIAGNOSIS — O212 Late vomiting of pregnancy: Secondary | ICD-10-CM | POA: Diagnosis present

## 2016-05-24 DIAGNOSIS — Z3A35 35 weeks gestation of pregnancy: Secondary | ICD-10-CM | POA: Insufficient documentation

## 2016-05-24 DIAGNOSIS — R7989 Other specified abnormal findings of blood chemistry: Secondary | ICD-10-CM

## 2016-05-24 DIAGNOSIS — R945 Abnormal results of liver function studies: Secondary | ICD-10-CM

## 2016-05-24 DIAGNOSIS — O4703 False labor before 37 completed weeks of gestation, third trimester: Principal | ICD-10-CM | POA: Insufficient documentation

## 2016-05-24 LAB — COMPREHENSIVE METABOLIC PANEL
ALT: 273 U/L — AB (ref 14–54)
ALT: 196 U/L — ABNORMAL HIGH (ref 14–54)
ANION GAP: 7 (ref 5–15)
AST: 130 U/L — AB (ref 15–41)
AST: 102 U/L — ABNORMAL HIGH (ref 15–41)
Albumin: 3.9 g/dL (ref 3.5–5.0)
Albumin: 2.8 g/dL — ABNORMAL LOW (ref 3.5–5.0)
Alkaline Phosphatase: 171 U/L — ABNORMAL HIGH (ref 38–126)
Alkaline Phosphatase: 109 U/L (ref 38–126)
Anion gap: 14 (ref 5–15)
BILIRUBIN TOTAL: 1.4 mg/dL — AB (ref 0.3–1.2)
BUN: 9 mg/dL (ref 6–20)
BUN: 6 mg/dL (ref 6–20)
CHLORIDE: 96 mmol/L — AB (ref 101–111)
CO2: 20 mmol/L — AB (ref 22–32)
CO2: 23 mmol/L (ref 22–32)
CREATININE: 0.66 mg/dL (ref 0.44–1.00)
Calcium: 11.7 mg/dL — ABNORMAL HIGH (ref 8.9–10.3)
Calcium: 9.4 mg/dL (ref 8.9–10.3)
Chloride: 101 mmol/L (ref 101–111)
Creatinine, Ser: 0.6 mg/dL (ref 0.44–1.00)
GFR calc non Af Amer: >60 mL/min (ref >60)
Glucose, Bld: 106 mg/dL — ABNORMAL HIGH (ref 65–99)
Glucose, Bld: 117 mg/dL — ABNORMAL HIGH (ref 65–99)
POTASSIUM: 3.6 mmol/L (ref 3.5–5.1)
Potassium: 4.3 mmol/L (ref 3.5–5.1)
SODIUM: 130 mmol/L — AB (ref 135–145)
Sodium: 131 mmol/L — ABNORMAL LOW (ref 135–145)
TOTAL PROTEIN: 5.6 g/dL — AB (ref 6.5–8.1)
Total Bilirubin: 1.7 mg/dL — ABNORMAL HIGH (ref 0.3–1.2)
Total Protein: 7.4 g/dL (ref 6.5–8.1)

## 2016-05-24 LAB — URINALYSIS, ROUTINE W REFLEX MICROSCOPIC
Bilirubin Urine: NEGATIVE
Glucose, UA: NEGATIVE mg/dL
Hgb urine dipstick: NEGATIVE
Ketones, ur: >80 mg/dL — AB
NITRITE: NEGATIVE
Protein, ur: NEGATIVE mg/dL
pH: 6.5 (ref 5.0–8.0)

## 2016-05-24 LAB — CBC WITH DIFFERENTIAL/PLATELET
Basophils Absolute: 0 10*3/uL (ref 0.0–0.1)
Basophils Relative: 0 %
Eosinophils Absolute: 0 10*3/uL (ref 0.0–0.7)
Eosinophils Relative: 0 %
HEMATOCRIT: 33.8 % — AB (ref 36.0–46.0)
Hemoglobin: 11.9 g/dL — ABNORMAL LOW (ref 12.0–15.0)
LYMPHS PCT: 23 %
Lymphs Abs: 2.6 10*3/uL (ref 0.7–4.0)
MCH: 31.5 pg (ref 26.0–34.0)
MCHC: 35.2 g/dL (ref 30.0–36.0)
MCV: 89.4 fL (ref 78.0–100.0)
MONO ABS: 0.5 10*3/uL (ref 0.1–1.0)
MONOS PCT: 4 %
NEUTROS ABS: 8.3 10*3/uL — AB (ref 1.7–7.7)
Neutrophils Relative %: 73 %
Platelets: 186 10*3/uL (ref 150–400)
RBC: 3.78 MIL/uL — ABNORMAL LOW (ref 3.87–5.11)
RDW: 13.4 % (ref 11.5–15.5)
WBC: 11.4 10*3/uL — ABNORMAL HIGH (ref 4.0–10.5)

## 2016-05-24 LAB — PROTEIN / CREATININE RATIO, URINE
CREATININE, URINE: 70 mg/dL
Protein Creatinine Ratio: 0.17 mg/mg{Cre} — ABNORMAL HIGH (ref 0.00–0.15)
TOTAL PROTEIN, URINE: 12 mg/dL

## 2016-05-24 LAB — CBC
HCT: 44.3 % (ref 36.0–46.0)
Hemoglobin: 15.9 g/dL — ABNORMAL HIGH (ref 12.0–15.0)
MCH: 31.9 pg (ref 26.0–34.0)
MCHC: 35.9 g/dL (ref 30.0–36.0)
MCV: 89 fL (ref 78.0–100.0)
PLATELETS: 243 10*3/uL (ref 150–400)
RBC: 4.98 MIL/uL (ref 3.87–5.11)
RDW: 13.2 % (ref 11.5–15.5)
WBC: 16.1 10*3/uL — ABNORMAL HIGH (ref 4.0–10.5)

## 2016-05-24 LAB — RAPID URINE DRUG SCREEN, HOSP PERFORMED
Amphetamines: NOT DETECTED
Barbiturates: NOT DETECTED
Benzodiazepines: POSITIVE — AB
COCAINE: NOT DETECTED
OPIATES: NOT DETECTED
TETRAHYDROCANNABINOL: POSITIVE — AB

## 2016-05-24 LAB — FIBRINOGEN: FIBRINOGEN: 492 mg/dL — AB (ref 210–475)

## 2016-05-24 LAB — URINE MICROSCOPIC-ADD ON

## 2016-05-24 LAB — PROTIME-INR
INR: 0.98
Prothrombin Time: 13 seconds (ref 11.4–15.2)

## 2016-05-24 LAB — ACETAMINOPHEN LEVEL

## 2016-05-24 LAB — TYPE AND SCREEN
ABO/RH(D): O POS
Antibody Screen: NEGATIVE

## 2016-05-24 LAB — OB RESULTS CONSOLE GBS: GBS: NEGATIVE

## 2016-05-24 LAB — AMYLASE: AMYLASE: 60 U/L (ref 28–100)

## 2016-05-24 LAB — HEPATITIS B SURFACE ANTIGEN: HEP B S AG: NEGATIVE

## 2016-05-24 LAB — LIPASE, BLOOD: LIPASE: 26 U/L (ref 11–51)

## 2016-05-24 LAB — GROUP B STREP BY PCR: GROUP B STREP BY PCR: NEGATIVE

## 2016-05-24 LAB — APTT: aPTT: 24 seconds (ref 24–36)

## 2016-05-24 MED ORDER — OXYCODONE-ACETAMINOPHEN 5-325 MG PO TABS: 1.0000 | ORAL_TABLET | ORAL | Status: DC | PRN

## 2016-05-24 MED ORDER — POLYETHYLENE GLYCOL 3350 17 G PO PACK
17.0000 g | PACK | Freq: Every day | ORAL | Status: DC | PRN
  Filled 2016-05-24: qty 1

## 2016-05-24 MED ORDER — NIFEDIPINE 10 MG PO CAPS
10.0000 mg | ORAL_CAPSULE | ORAL | Status: DC | PRN
  Filled 2016-05-24: qty 1

## 2016-05-24 MED ORDER — HYDROXYZINE HCL 25 MG PO TABS
25.0000 mg | ORAL_TABLET | Freq: Three times a day (TID) | ORAL | Status: DC | PRN
  Filled 2016-05-24: qty 1

## 2016-05-24 MED ORDER — LACTATED RINGERS IV BOLUS (SEPSIS): 1000.0000 mL | Freq: Once | INTRAVENOUS | Status: DC

## 2016-05-24 MED ORDER — DOCUSATE SODIUM 100 MG PO CAPS
100.0000 mg | ORAL_CAPSULE | Freq: Two times a day (BID) | ORAL | Status: DC | PRN
  Filled 2016-05-24: qty 1

## 2016-05-24 MED ORDER — NIFEDIPINE 10 MG PO CAPS
10.0000 mg | ORAL_CAPSULE | ORAL | Status: DC | PRN
  Administered 2016-05-24 (×2): 10 mg via ORAL
  Filled 2016-05-24: qty 1

## 2016-05-24 MED ORDER — SERTRALINE HCL 50 MG PO TABS
50.0000 mg | ORAL_TABLET | Freq: Every day | ORAL | Status: DC
  Administered 2016-05-24: 50 mg via ORAL
  Filled 2016-05-24: qty 1

## 2016-05-24 MED ORDER — PANTOPRAZOLE SODIUM 40 MG PO TBEC
40.0000 mg | DELAYED_RELEASE_TABLET | Freq: Every day | ORAL | Status: DC | PRN
  Filled 2016-05-24: qty 1

## 2016-05-24 MED ORDER — NIFEDIPINE 10 MG PO CAPS
10.0000 mg | ORAL_CAPSULE | Freq: Once | ORAL | Status: AC
  Administered 2016-05-24: 10 mg via ORAL
  Filled 2016-05-24: qty 1

## 2016-05-24 MED ORDER — PROMETHAZINE HCL 25 MG/ML IJ SOLN: 25.0000 mg | Freq: Once | INTRAMUSCULAR | Status: DC

## 2016-05-24 MED ORDER — CALCIUM CARBONATE ANTACID 500 MG PO CHEW: 2.0000 | CHEWABLE_TABLET | ORAL | Status: DC | PRN

## 2016-05-24 MED ORDER — HYDROXYZINE HCL 25 MG PO TABS: 25.0000 mg | ORAL_TABLET | Freq: Four times a day (QID) | ORAL | Status: DC | PRN

## 2016-05-24 MED ORDER — ZOLPIDEM TARTRATE 5 MG PO TABS: 5.0000 mg | ORAL_TABLET | Freq: Every evening | ORAL | Status: DC | PRN

## 2016-05-24 MED ORDER — PRENATAL MULTIVITAMIN CH
1.0000 | ORAL_TABLET | Freq: Every day | ORAL | Status: DC
  Filled 2016-05-24: qty 1

## 2016-05-24 MED ORDER — LACTATED RINGERS IV SOLN
INTRAVENOUS | Status: DC
  Administered 2016-05-24: 11:00:00 via INTRAVENOUS

## 2016-05-24 MED ORDER — TERBUTALINE SULFATE 1 MG/ML IJ SOLN
0.2500 mg | Freq: Once | INTRAMUSCULAR | Status: AC
  Administered 2016-05-24: 0.25 mg via SUBCUTANEOUS
  Filled 2016-05-24: qty 1

## 2016-05-24 MED ORDER — ONDANSETRON HCL 40 MG/20ML IJ SOLN
8.0000 mg | Freq: Once | INTRAMUSCULAR | Status: AC
Start: 2016-05-24 — End: 2016-05-24
  Administered 2016-05-24: 8 mg via INTRAVENOUS
  Filled 2016-05-24: qty 4

## 2016-05-24 MED ORDER — ONDANSETRON HCL 40 MG/20ML IJ SOLN
8.0000 mg | Freq: Three times a day (TID) | INTRAMUSCULAR | Status: DC
  Filled 2016-05-24 (×4): qty 4

## 2016-05-24 MED ORDER — ACETAMINOPHEN 500 MG PO TABS
1000.0000 mg | ORAL_TABLET | Freq: Once | ORAL | Status: AC
  Administered 2016-05-24: 1000 mg via ORAL
  Filled 2016-05-24: qty 2

## 2016-05-24 MED ORDER — HYDROXYZINE HCL 50 MG PO TABS
50.0000 mg | ORAL_TABLET | Freq: Four times a day (QID) | ORAL | Status: DC | PRN
  Administered 2016-05-24 – 2016-05-25 (×2): 50 mg via ORAL
  Filled 2016-05-24 (×4): qty 1

## 2016-05-24 MED ORDER — DOCUSATE SODIUM 100 MG PO CAPS
100.0000 mg | ORAL_CAPSULE | Freq: Every day | ORAL | Status: DC
  Filled 2016-05-24: qty 1

## 2016-05-24 MED ORDER — ACETAMINOPHEN 325 MG PO TABS: 650.0000 mg | ORAL_TABLET | ORAL | Status: DC | PRN

## 2016-05-24 MED ORDER — PANTOPRAZOLE SODIUM 40 MG PO TBEC
40.0000 mg | DELAYED_RELEASE_TABLET | Freq: Every day | ORAL | Status: DC
  Administered 2016-05-24: 40 mg via ORAL
  Filled 2016-05-24: qty 1

## 2016-05-24 NOTE — H&P (Signed)
History     CSN: [id number]  Arrival date and time: 05/24/16 7829   First Provider Initiated Contact with Patient 05/24/16 0416         Chief Complaint  Patient presents with  . Contractions  . Emesis   Laura Frank is a 25 y.o. G2P0010 at [redacted]w[redacted]d who presents today with contractions. She states that she has had contractions off and on for about a week. She states that she was given medication to stop contractions. She was given procardia on 05/17/16, and she has been taking it since. She states that the contractions have increased today, and she states that she has had nausea and vomiting all day. She reports a headache today. She denies any visual disturbances, or RUQ pain. She states that she was FT at her last exam.   Emesis   This is a new problem. The current episode started more than 1 month ago. The problem occurs 2 to 4 times per day. The problem has been unchanged. The emesis has an appearance of stomach contents. There has been no fever. Associated symptoms include headaches. Pertinent negatives include no abdominal pain, chills or fever. Risk factors: pregnancy  Treatments tried: phenergan  The treatment provided no relief.       Past Medical History:  Diagnosis Date  . Cyclic vomiting syndrome 01/05/2015   marijuana induced emesis  . Generalized anxiety disorder   . GERD (gastroesophageal reflux disease)   . Marijuana use    daily  . Panic attacks          Past Surgical History:  Procedure Laterality Date  . NO PAST SURGERIES           Family History  Problem Relation Age of Onset  . Alcohol abuse Neg Hx   . Arthritis Neg Hx   . Asthma Neg Hx   . Birth defects Neg Hx   . Cancer Neg Hx   . COPD Neg Hx   . Depression Neg Hx   . Diabetes Neg Hx   . Drug abuse Neg Hx   . Early death Neg Hx   . Hearing loss Neg Hx   . Heart disease Neg Hx   . Hyperlipidemia Neg Hx   . Hypertension Neg Hx   . Kidney disease Neg Hx   .  Learning disabilities Neg Hx   . Mental illness Neg Hx   . Mental retardation Neg Hx   . Miscarriages / Stillbirths Neg Hx   . Stroke Neg Hx   . Vision loss Neg Hx   . Varicose Veins Neg Hx         Social History  Substance Use Topics  . Smoking status: Never Smoker  . Smokeless tobacco: Never Used  . Alcohol use No    Allergies: No Known Allergies         Prescriptions Prior to Admission  Medication Sig Dispense Refill Last Dose  . calcium carbonate (TUMS EX) 750 MG chewable tablet Chew 1 tablet by mouth as needed for heartburn.   05/24/2016 at 0001  . hydrOXYzine (VISTARIL) 25 MG capsule Take 25 mg by mouth 3 (three) times daily as needed for anxiety.   05/23/2016 at 2200  . NIFEdipine (PROCARDIA) 10 MG capsule Take 1 capsule (10 mg total) by mouth every 4 (four) hours as needed. 30 capsule 0 05/24/2016 at 0001  . ondansetron (ZOFRAN-ODT) 8 MG disintegrating tablet Take 8 mg by mouth every 8 (eight) hours as needed for nausea or  vomiting.   1 Past Week at Unknown time  . pantoprazole (PROTONIX) 40 MG tablet Take 40 mg by mouth at bedtime.   05/23/2016 at 2200  . polyethylene glycol (MIRALAX / GLYCOLAX) packet Take 17 g by mouth daily as needed for mild constipation or moderate constipation.   05/23/2016 at 1800  . Prenatal Vit-Fe Fumarate-FA (PRENATAL MULTIVITAMIN) TABS tablet Take 1 tablet by mouth at bedtime.    05/23/2016 at 2200  . promethazine (PHENERGAN) 25 MG tablet Take 25 mg by mouth every 6 (six) hours as needed for nausea or vomiting.   05/24/2016 at 0001  . sertraline (ZOLOFT) 50 MG tablet Take 50 mg by mouth at bedtime.   05/23/2016 at 2200    Review of Systems  Constitutional: Negative for chills and fever.  HENT: Positive for congestion.   Eyes: Negative for blurred vision.  Gastrointestinal: Positive for vomiting. Negative for abdominal pain.  Neurological: Positive for headaches.   Physical Exam   Blood pressure 128/93, pulse  107, temperature 97.6 F (36.4 C), temperature source Oral, resp. rate 20, weight 110 lb 8 oz (50.1 kg), last menstrual period 11/18/2014, unknown if currently breastfeeding.  Physical Exam  Nursing note and vitals reviewed. Constitutional: She is oriented to person, place, and time. She appears well-developed and well-nourished. No distress.  HENT:  Head: Normocephalic.  Cardiovascular: Normal rate.   Respiratory: Effort normal.  GI: Soft. There is no tenderness. There is no rebound.  Genitourinary:  Genitourinary Comments:  FT/50/-1  Neurological: She is alert and oriented to person, place, and time.  Skin: Skin is warm and dry.  Psychiatric: She has a normal mood and affect.   FHT: 145, moderate with 15x15 accels, one prolonged decel RN updated [facility] when decel occurred as I was on the phone with a physician at another facility. Toco: about every 2-3 mins   Lab Results Last 24 Hours       Results for orders placed or performed during the hospital encounter of 05/24/16 (from the past 24 hour(s))  Urinalysis, Routine w reflex microscopic ([facility])     Status: Abnormal   Collection Time: 05/24/16  3:55 AM  Result Value Ref Range   Color, Urine YELLOW YELLOW   APPearance CLEAR CLEAR   Specific Gravity, Urine <1.005 (L) 1.005 - 1.030   pH 6.5 5.0 - 8.0   Glucose, UA NEGATIVE NEGATIVE mg/dL   Hgb urine dipstick NEGATIVE NEGATIVE   Bilirubin Urine NEGATIVE NEGATIVE   Ketones, ur >80 (A) NEGATIVE mg/dL   Protein, ur NEGATIVE NEGATIVE mg/dL   Nitrite NEGATIVE NEGATIVE   Leukocytes, UA SMALL (A) NEGATIVE  Protein / creatinine ratio, urine     Status: Abnormal   Collection Time: 05/24/16  3:55 AM  Result Value Ref Range   Creatinine, Urine 70.00 mg/dL   Total Protein, Urine 12 mg/dL   Protein Creatinine Ratio 0.17 (H) 0.00 - 0.15 mg/mg[Cre]  Urine microscopic-add on     Status: Abnormal   Collection Time: 05/24/16  3:55 AM  Result Value Ref Range    Squamous Epithelial / LPF 0-5 (A) NONE SEEN   WBC, UA 6-30 0 - 5 WBC/hpf   RBC / HPF 0-5 0 - 5 RBC/hpf   Bacteria, UA RARE (A) NONE SEEN  CBC     Status: Abnormal   Collection Time: 05/24/16  4:30 AM  Result Value Ref Range   WBC 16.1 (H) 4.0 - 10.5 K/uL   RBC 4.98 3.87 - 5.11 MIL/uL  Hemoglobin 15.9 (H) 12.0 - 15.0 g/dL   HCT 16.1 09.6 - 04.5 %   MCV 89.0 78.0 - 100.0 fL   MCH 31.9 26.0 - 34.0 pg   MCHC 35.9 30.0 - 36.0 g/dL   RDW 40.9 81.1 - 91.4 %   Platelets 243 150 - 400 K/uL  Comprehensive metabolic panel     Status: Abnormal   Collection Time: 05/24/16  4:30 AM  Result Value Ref Range   Sodium 130 (L) 135 - 145 mmol/L   Potassium 4.3 3.5 - 5.1 mmol/L   Chloride 96 (L) 101 - 111 mmol/L   CO2 20 (L) 22 - 32 mmol/L   Glucose, Bld 106 (H) 65 - 99 mg/dL   BUN 9 6 - 20 mg/dL   Creatinine, Ser 7.82 0.44 - 1.00 mg/dL   Calcium 95.6 (H) 8.9 - 10.3 mg/dL   Total Protein 7.4 6.5 - 8.1 g/dL   Albumin 3.9 3.5 - 5.0 g/dL   AST 213 (H) 15 - 41 U/L   ALT 273 (H) 14 - 54 U/L   Alkaline Phosphatase 171 (H) 38 - 126 U/L   Total Bilirubin 1.7 (H) 0.3 - 1.2 mg/dL   GFR calc non Af Amer >60 >60 mL/min   GFR calc Af Amer >60 >60 mL/min   Anion gap 14 5 - 15      MAU Course  Procedures  MDM LR bolus, Zofran IV, Terbutaline.  CBC, CMET, protein/creatinine ratio.  0865: RN has updated [facility] CNM for orders as I was with another patient.  0550: K. Mayford Knife, CNM assumes care of the patient.   Assessment and Plan  Nausea vomiting pregnancy Elevated liver enzymes, hypertension   Addendum: Admit for observation per consult w/ Dr. Richardson Dopp; see orders.   Cat 1 FHRT. Irregular ctxs.   Procardia x 1 now.   Tyl 1 gram now. Urine culture pending. Lipase, Amylase normal. UDS - see below.   Results for orders placed or performed during the hospital encounter of 05/24/16 (from the past 24 hour(s))  Rapid urine drug screen (hospital performed)     Status:  Abnormal   Collection Time: 05/24/16  3:53 AM  Result Value Ref Range   Opiates NONE DETECTED NONE DETECTED   Cocaine NONE DETECTED NONE DETECTED   Benzodiazepines POSITIVE (A) NONE DETECTED   Amphetamines NONE DETECTED NONE DETECTED   Tetrahydrocannabinol POSITIVE (A) NONE DETECTED   Barbiturates NONE DETECTED NONE DETECTED  Urinalysis, Routine w reflex microscopic (not at [facility])     Status: Abnormal   Collection Time: 05/24/16  3:55 AM  Result Value Ref Range   Color, Urine YELLOW YELLOW   APPearance CLEAR CLEAR   Specific Gravity, Urine <1.005 (L) 1.005 - 1.030   pH 6.5 5.0 - 8.0   Glucose, UA NEGATIVE NEGATIVE mg/dL   Hgb urine dipstick NEGATIVE NEGATIVE   Bilirubin Urine NEGATIVE NEGATIVE   Ketones, ur >80 (A) NEGATIVE mg/dL   Protein, ur NEGATIVE NEGATIVE mg/dL   Nitrite NEGATIVE NEGATIVE   Leukocytes, UA SMALL (A) NEGATIVE  Protein / creatinine ratio, urine     Status: Abnormal   Collection Time: 05/24/16  3:55 AM  Result Value Ref Range   Creatinine, Urine 70.00 mg/dL   Total Protein, Urine 12 mg/dL   Protein Creatinine Ratio 0.17 (H) 0.00 - 0.15 mg/mg[Cre]  Urine microscopic-add on     Status: Abnormal   Collection Time: 05/24/16  3:55 AM  Result Value Ref Range   Squamous Epithelial /  LPF 0-5 (A) NONE SEEN   WBC, UA 6-30 0 - 5 WBC/hpf   RBC / HPF 0-5 0 - 5 RBC/hpf   Bacteria, UA RARE (A) NONE SEEN  CBC     Status: Abnormal   Collection Time: 05/24/16  4:30 AM  Result Value Ref Range   WBC 16.1 (H) 4.0 - 10.5 K/uL   RBC 4.98 3.87 - 5.11 MIL/uL   Hemoglobin 15.9 (H) 12.0 - 15.0 g/dL   HCT 16.144.3 09.636.0 - 04.546.0 %   MCV 89.0 78.0 - 100.0 fL   MCH 31.9 26.0 - 34.0 pg   MCHC 35.9 30.0 - 36.0 g/dL   RDW 40.913.2 81.111.5 - 91.415.5 %   Platelets 243 150 - 400 K/uL  Comprehensive metabolic panel     Status: Abnormal   Collection Time: 05/24/16  4:30 AM  Result Value Ref Range   Sodium 130 (L) 135 - 145 mmol/L   Potassium 4.3 3.5 - 5.1 mmol/L   Chloride 96 (L) 101 - 111 mmol/L    CO2 20 (L) 22 - 32 mmol/L   Glucose, Bld 106 (H) 65 - 99 mg/dL   BUN 9 6 - 20 mg/dL   Creatinine, Ser 7.820.66 0.44 - 1.00 mg/dL   Calcium 95.611.7 (H) 8.9 - 10.3 mg/dL   Total Protein 7.4 6.5 - 8.1 g/dL   Albumin 3.9 3.5 - 5.0 g/dL   AST 213130 (H) 15 - 41 U/L   ALT 273 (H) 14 - 54 U/L   Alkaline Phosphatase 171 (H) 38 - 126 U/L   Total Bilirubin 1.7 (H) 0.3 - 1.2 mg/dL   GFR calc non Af Amer >60 >60 mL/min   GFR calc Af Amer >60 >60 mL/min   Anion gap 14 5 - 15  Amylase     Status: None   Collection Time: 05/24/16  4:30 AM  Result Value Ref Range   Amylase 60 28 - 100 U/L  Lipase, blood     Status: None   Collection Time: 05/24/16  4:30 AM  Result Value Ref Range   Lipase 26 11 - 51 U/L  [facility] and screen [facility]     Status: None (Preliminary result)   Collection Time: 05/24/16  4:30 AM  Result Value Ref Range   ABO/RH(D) PENDING    Antibody Screen NEG    Sample Expiration 05/27/2016      Sherre ScarletKimberly Melyna Huron, CNM 05/24/16, 6:37 AM

## 2016-05-24 NOTE — MAU Note (Signed)
PT SAYS  SHE  STARTED VOMITED TODAY AT   12NOON.     AFTER  TAKING  MEDS.      FEELS  UC-      VE -   FT.

## 2016-05-24 NOTE — Progress Notes (Addendum)
Laura Frank, Laura Frank, 25 y.o., May 04, 1991  35 Weeks 5 days EGA with nausea and vomiting, elevated LFTs  Subjective: Patient reports lower back pain which is constant and intermittently worsening.  Feels abdominal tightening with contractions but not painful contractions.   She does not have nausea or vomiting any more since zofran use, now asking to eat.  She denies headaches, blurry vision, chest pain, shortness of breath, palpitations, nausea, vomiting, right upper quadrant pain or epigastric pain.  She denies vaginal bleeding or leakage of fluid.  With normal gross fetal movement.  She denies any pruritus.     Objective: I have reviewed patient's vital signs, medications, labs and radiology results. Vitals:   05/24/16 0808 05/24/16 0909 05/24/16 0911 05/24/16 1110  BP:  117/79 117/79   Pulse:  (!) 105    Resp: 18 18 18 18   Temp:      TempSrc:      SpO2:      Weight:        General: alert, cooperative and mild distress Resp: clear to auscultation bilaterally Cardio: regular rate and rhythm, S1, S2 normal, no murmur, click, rub or gallop GI: soft, non-tender; bowel sounds normal; no masses,  no organomegaly and soft, gravid, tender to palpation in right lower quadrant- no rebound, no guarding.  6cm firm mass in left lower quadrant, non tender to touch.   Extremities: no edema, redness or tenderness in the calves or thighs.  2+ patellar reflex bilaterally.    Gravid, non tender uterus.   NST: 140 BL, mod variability, reactive. TOCO: Irregular contractions Q 10 minutes.  Cervix: Fingertip/50%/-3  Additional labs 05/24/2016:   APTT (coagulopathy lab panel)  Order: [id number]  Status:  Final result Visible to patient:  No (Not Released) Next appt:  None   Ref Range & Units 10:46  aPTT 24 - 36 seconds 24         Protime-INR (coagulopathy lab panel)  Order: [id number]  Status:  Final result Visible to patient:  No (Not Released) Next appt:  None   Ref Range & Units 10:46   Prothrombin Time 11.4 - 15.2 seconds 13.0   INR  0.98   Resulting Agency  [facility]    Specimen Collected: 05/24/16 10:46 Last Resulted: 05/24/16 11:16         Fibrinogen (coagulopathy lab panel)  Order: [id number]  Status:  Final result Visible to patient:  No (Not Released) Next appt:  None   Ref Range & Units 10:46  Fibrinogen 210 - 475 mg/dL [id number]    Resulting Agency  [facility]    Specimen Collected: 05/24/16 10:46 Last Resulted: 05/24/16 11:16           Group B strep by PCR  Order: [id number]  Status:  Final result Visible to patient:  No (Not Released) Next appt:  None   Ref Range & Units 09:11  Group B strep by PCR NEGATIVE NEGATIVE   Resulting Agency  [facility]    Specimen Collected: 05/24/16 09:11 Last Resulted: 05/24/16 10:09           Ultrasound at office 05/23/2016: EFW 5lb 6 oz (20th%), AFI 18cm, Cephalic presentation.   Rapid urine drug screen (hospital performed)  Order: [id number]  Status:  Final result Visible to patient:  No (Not Released) Next appt:  None   Ref Range & Units 03:53  Opiates NONE DETECTED NONE DETECTED   Cocaine NONE DETECTED NONE DETECTED   Benzodiazepines NONE DETECTED POSITIVE    Amphetamines NONE DETECTED  NONE DETECTED   Tetrahydrocannabinol NONE DETECTED POSITIVE    Barbiturates NONE DETECTED NONE DETECTED   Comments:     DRUG SCREEN FOR MEDICAL PURPOSES  ONLY. IF CONFIRMATION IS NEEDED  FOR ANY PURPOSE, NOTIFY LAB  WITHIN 5 DAYS.      LOWEST DETECTABLE LIMITS  FOR URINE DRUG SCREEN  Drug Class    Cutoff (ng/mL)  Amphetamine   1000  Barbiturate   200  Benzodiazepine  200  Tricyclics    300  Opiates     300  Cocaine     300  THC       50   Resulting Agency  [facility]    Specimen Collected: 05/24/16 03:53 Last Resulted: 05/24/16 05:42          Assessment/Plan: 25 y/o G2P0010 @ 35W 5 Days EGA with history of chronic nausea and vomiting, marijuana use, admitted for  worsened nausea and vomiting, preterm contractions, found to have elevated LFTs, positive drug screen for benzodiazepines, marijuana, LFTs elevated likely do to chronic nausea and vomiting, stable maternal and fetal status,  -Abdominal ultrasound to evaluate liver, left lower quadrant mass -Hepatitis panel testing -Repeat CBC, CMP at 1600,as well as acetaminophen level -Procardia for preterm contractions prn -Percocet for pain prn -Continuous NST, if remains stable will change to NST every shift -Patient has been counseled before on substance abuse avoidance, she states she last used marijuana one month ago and denies use of benzodiazepines (but drug screen is positive).       LOS: 0 days    Konrad FelixKULWA,Norlan Rann WAKURU, MD.  05/24/2016, 12:25 PM

## 2016-05-24 NOTE — MAU Provider Note (Signed)
History     CSN: [id number]  Arrival date and time: 05/24/16 [id number]   First Provider Initiated Contact with Patient 05/24/16 0416      Chief Complaint  Patient presents with  . Contractions  . Emesis   Laura Frank is a 25 y.o. G2P0010 at [redacted]w[redacted]d who presents today with contractions. She states that she has had contractions off and on for about a week. She states that she was given medication to stop contractions. She was given procardia on 05/17/16, and she has been taking it since. She states that the contractions have increased today, and she states that she has had nausea and vomiting all day. She reports a headache today. She denies any visual disturbances, or RUQ pain. She states that she was FT at her last exam.    Emesis   This is a new problem. The current episode started more than 1 month ago. The problem occurs 2 to 4 times per day. The problem has been unchanged. The emesis has an appearance of stomach contents. There has been no fever. Associated symptoms include headaches. Pertinent negatives include no abdominal pain, chills or fever. Risk factors: pregnancy  Treatments tried: phenergan  The treatment provided no relief.   Past Medical History:  Diagnosis Date  . Cyclic vomiting syndrome 01/05/2015   marijuana induced emesis  . Generalized anxiety disorder   . GERD (gastroesophageal reflux disease)   . Marijuana use    daily  . Panic attacks     Past Surgical History:  Procedure Laterality Date  . NO PAST SURGERIES      Family History  Problem Relation Age of Onset  . Alcohol abuse Neg Hx   . Arthritis Neg Hx   . Asthma Neg Hx   . Birth defects Neg Hx   . Cancer Neg Hx   . COPD Neg Hx   . Depression Neg Hx   . Diabetes Neg Hx   . Drug abuse Neg Hx   . Early death Neg Hx   . Hearing loss Neg Hx   . Heart disease Neg Hx   . Hyperlipidemia Neg Hx   . Hypertension Neg Hx   . Kidney disease Neg Hx   . Learning disabilities Neg Hx   . Mental illness Neg  Hx   . Mental retardation Neg Hx   . Miscarriages / Stillbirths Neg Hx   . Stroke Neg Hx   . Vision loss Neg Hx   . Varicose Veins Neg Hx     Social History  Substance Use Topics  . Smoking status: Never Smoker  . Smokeless tobacco: Never Used  . Alcohol use No    Allergies: No Known Allergies  Prescriptions Prior to Admission  Medication Sig Dispense Refill Last Dose  . calcium carbonate (TUMS EX) 750 MG chewable tablet Chew 1 tablet by mouth as needed for heartburn.   05/24/2016 at 0001  . hydrOXYzine (VISTARIL) 25 MG capsule Take 25 mg by mouth 3 (three) times daily as needed for anxiety.   05/23/2016 at 2200  . NIFEdipine (PROCARDIA) 10 MG capsule Take 1 capsule (10 mg total) by mouth Frank 4 (four) hours as needed. 30 capsule 0 05/24/2016 at 0001  . ondansetron (ZOFRAN-ODT) 8 MG disintegrating tablet Take 8 mg by mouth Frank 8 (eight) hours as needed for nausea or vomiting.   1 Past Week at Unknown time  . pantoprazole (PROTONIX) 40 MG tablet Take 40 mg by mouth at bedtime.   05/23/2016 at  2200  . polyethylene glycol (MIRALAX / GLYCOLAX) packet Take 17 g by mouth daily as needed for mild constipation or moderate constipation.   05/23/2016 at 1800  . Prenatal Vit-Fe Fumarate-FA (PRENATAL MULTIVITAMIN) TABS tablet Take 1 tablet by mouth at bedtime.    05/23/2016 at 2200  . promethazine (PHENERGAN) 25 MG tablet Take 25 mg by mouth Frank 6 (six) hours as needed for nausea or vomiting.   05/24/2016 at 0001  . sertraline (ZOLOFT) 50 MG tablet Take 50 mg by mouth at bedtime.   05/23/2016 at 2200    Review of Systems  Constitutional: Negative for chills and fever.  HENT: Positive for congestion.   Eyes: Negative for blurred vision.  Gastrointestinal: Positive for vomiting. Negative for abdominal pain.  Neurological: Positive for headaches.   Physical Exam   Blood pressure 128/93, pulse 107, temperature 97.6 F (36.4 C), temperature source Oral, resp. rate 20, weight 110 lb 8 oz  (50.1 kg), last menstrual period 11/18/2014, unknown if currently breastfeeding.  Physical Exam  Nursing note and vitals reviewed. Constitutional: She is oriented to person, place, and time. She appears well-developed and well-nourished. No distress.  HENT:  Head: Normocephalic.  Cardiovascular: Normal rate.   Respiratory: Effort normal.  GI: Soft. There is no tenderness. There is no rebound.  Genitourinary:  Genitourinary Comments:  FT/50/-1  Neurological: She is alert and oriented to person, place, and time.  Skin: Skin is warm and dry.  Psychiatric: She has a normal mood and affect.   FHT: 145, moderate with 15x15 accels, one prolonged decel RN updated [facility] when decel occurred as I was on the phone with a physician at another facility. Toco: about Frank 2-3 mins   Results for orders placed or performed during the hospital encounter of 05/24/16 (from the past 24 hour(s))  Urinalysis, Routine w reflex microscopic ([facility])     Status: Abnormal   Collection Time: 05/24/16  3:55 AM  Result Value Ref Range   Color, Urine YELLOW YELLOW   APPearance CLEAR CLEAR   Specific Gravity, Urine <1.005 (L) 1.005 - 1.030   pH 6.5 5.0 - 8.0   Glucose, UA NEGATIVE NEGATIVE mg/dL   Hgb urine dipstick NEGATIVE NEGATIVE   Bilirubin Urine NEGATIVE NEGATIVE   Ketones, ur >80 (A) NEGATIVE mg/dL   Protein, ur NEGATIVE NEGATIVE mg/dL   Nitrite NEGATIVE NEGATIVE   Leukocytes, UA SMALL (A) NEGATIVE  Protein / creatinine ratio, urine     Status: Abnormal   Collection Time: 05/24/16  3:55 AM  Result Value Ref Range   Creatinine, Urine 70.00 mg/dL   Total Protein, Urine 12 mg/dL   Protein Creatinine Ratio 0.17 (H) 0.00 - 0.15 mg/mg[Cre]  Urine microscopic-add on     Status: Abnormal   Collection Time: 05/24/16  3:55 AM  Result Value Ref Range   Squamous Epithelial / LPF 0-5 (A) NONE SEEN   WBC, UA 6-30 0 - 5 WBC/hpf   RBC / HPF 0-5 0 - 5 RBC/hpf   Bacteria, UA RARE (A) NONE SEEN  CBC      Status: Abnormal   Collection Time: 05/24/16  4:30 AM  Result Value Ref Range   WBC 16.1 (H) 4.0 - 10.5 K/uL   RBC 4.98 3.87 - 5.11 MIL/uL   Hemoglobin 15.9 (H) 12.0 - 15.0 g/dL   HCT 16.144.3 09.636.0 - 04.546.0 %   MCV 89.0 78.0 - 100.0 fL   MCH 31.9 26.0 - 34.0 pg   MCHC 35.9 30.0 -  36.0 g/dL   RDW 16.1 09.6 - 04.5 %   Platelets 243 150 - 400 K/uL  Comprehensive metabolic panel     Status: Abnormal   Collection Time: 05/24/16  4:30 AM  Result Value Ref Range   Sodium 130 (L) 135 - 145 mmol/L   Potassium 4.3 3.5 - 5.1 mmol/L   Chloride 96 (L) 101 - 111 mmol/L   CO2 20 (L) 22 - 32 mmol/L   Glucose, Bld 106 (H) 65 - 99 mg/dL   BUN 9 6 - 20 mg/dL   Creatinine, Ser 4.09 0.44 - 1.00 mg/dL   Calcium 81.1 (H) 8.9 - 10.3 mg/dL   Total Protein 7.4 6.5 - 8.1 g/dL   Albumin 3.9 3.5 - 5.0 g/dL   AST 914 (H) 15 - 41 U/L   ALT 273 (H) 14 - 54 U/L   Alkaline Phosphatase 171 (H) 38 - 126 U/L   Total Bilirubin 1.7 (H) 0.3 - 1.2 mg/dL   GFR calc non Af Amer >60 >60 mL/min   GFR calc Af Amer >60 >60 mL/min   Anion gap 14 5 - 15    MAU Course  Procedures  MDM LR bolus, 25mg  phenergan  CBC, CMET, protein/creatinine ratio pending.  7829: RN has updated [facility] CNM for orders as I was with another patient.  0550: K. Mayford Knife, CNM assumes care of the patient.   Assessment and Plan  Nausea vomiting pregnancy Elevated liver enzymes, hypertension  [facility] CNM will continue evaluation and management   Tawnya Crook 05/24/2016, 4:17 AM

## 2016-05-25 LAB — HEPATITIS C ANTIBODY: HCV Ab: <0.1 s/co ratio (ref 0.0–0.9)

## 2016-05-25 LAB — COMPREHENSIVE METABOLIC PANEL
ALT: 182 U/L — ABNORMAL HIGH (ref 14–54)
ANION GAP: 4 — AB (ref 5–15)
AST: 88 U/L — ABNORMAL HIGH (ref 15–41)
Albumin: 2.7 g/dL — ABNORMAL LOW (ref 3.5–5.0)
Alkaline Phosphatase: 106 U/L (ref 38–126)
BILIRUBIN TOTAL: 0.5 mg/dL (ref 0.3–1.2)
BUN: 7 mg/dL (ref 6–20)
CO2: 26 mmol/L (ref 22–32)
Calcium: 9.4 mg/dL (ref 8.9–10.3)
Chloride: 105 mmol/L (ref 101–111)
Creatinine, Ser: 0.6 mg/dL (ref 0.44–1.00)
GFR calc Af Amer: >60 mL/min (ref >60)
Glucose, Bld: 90 mg/dL (ref 65–99)
POTASSIUM: 4.5 mmol/L (ref 3.5–5.1)
Sodium: 135 mmol/L (ref 135–145)
TOTAL PROTEIN: 5 g/dL — AB (ref 6.5–8.1)

## 2016-05-25 LAB — CULTURE, OB URINE

## 2016-05-25 LAB — HEPATITIS A ANTIBODY, IGM: HEP A IGM: NEGATIVE

## 2016-05-25 NOTE — Discharge Summary (Signed)
OB Discharge Summary     Patient Name: Laura PainHannah Frank DOB: October 20, 1990 MRN: [id number]  Date of admission: 05/24/2016 Delivering MD: This patient has no babies on file.  Date of discharge: 05/25/2016  Admitting diagnosis: 35 wks unable to keep anything down having ctx Intrauterine pregnancy: 5825w6d     Secondary diagnosis:  Active Problems:   Preterm uterine contractions in third trimester, antepartum  Additional problems: Anxiety     Discharge diagnosis: Nausea and vomiting in pregnancy  Preterm contractions                                                                                             Complications: None  Hospital course:   Admitted for observation on 05/24/2016 at 35.5 for nausea and vomiting and elevated LFTs.  Labs trending down.  Pt had not more vomiting.  Hepatitis panel A,B, C negative.  Urine drug screen positive for THC and benzodiazepines.  Will be discharged home on 05/25/2016 . Will follow up on Monday in office.  Will continue Zofran and Zoloft at home.  If having contractions will take Procardia. Pt aware of plan of care.    Physical exam  Vitals:   05/24/16 1711 05/24/16 1802 05/24/16 1832 05/24/16 1931  BP:    111/67  Pulse:    98  Resp: 18 16 16 16   Temp:    98 F (36.7 C)  TempSrc:    Oral  SpO2:      Weight:       General: alert, cooperative and no distress Lochia: N/A Uterine gravid nontender Incision: N/A DVT Evaluation: No evidence of DVT seen on physical exam. Negative Homan's sign. Labs: Lab Results  Component Value Date   WBC 11.4 (H) 05/24/2016   HGB 11.9 (L) 05/24/2016   HCT 33.8 (L) 05/24/2016   MCV 89.4 05/24/2016   PLT 186 05/24/2016   CMP Latest Ref Rng & Units 05/24/2016  Glucose 65 - 99 mg/dL 409(W117(H)  BUN 6 - 20 mg/dL 6  Creatinine 1.190.44 - 1.471.00 mg/dL 8.290.60  Sodium 562135 - 130145 mmol/L 131(L)  Potassium 3.5 - 5.1 mmol/L 3.6  Chloride 101 - 111 mmol/L 101  CO2 22 - 32 mmol/L 23  Calcium 8.9 - 10.3 mg/dL 9.4  Total  Protein 6.5 - 8.1 g/dL 8.6(V5.6(L)  Total Bilirubin 0.3 - 1.2 mg/dL 7.8(I1.4(H)  Alkaline Phos 38 - 126 U/L 109  AST 15 - 41 U/L 102(H)  ALT 14 - 54 U/L 196(H)    Discharge instruction: per After Visit Summary and "Baby and Me Booklet".  After visit meds:    Medication List    TAKE these medications   acetaminophen 500 MG tablet Commonly known as:  TYLENOL Take 500 mg by mouth every 6 (six) hours as needed for mild Frank.   calcium carbonate 750 MG chewable tablet Commonly known as:  TUMS EX Chew 1 tablet by mouth as needed for heartburn.   hydrOXYzine 25 MG capsule Commonly known as:  VISTARIL Take 25 mg by mouth 3 (three) times daily as needed for anxiety.   NIFEdipine 10 MG capsule Commonly known as:  PROCARDIA Take 1 capsule (10 mg total) by mouth every 4 (four) hours as needed.   ondansetron 8 MG disintegrating tablet Commonly known as:  ZOFRAN-ODT Take 8 mg by mouth every 8 (eight) hours as needed for nausea or vomiting.   pantoprazole 40 MG tablet Commonly known as:  PROTONIX Take 40 mg by mouth at bedtime.   polyethylene glycol packet Commonly known as:  MIRALAX / GLYCOLAX Take 17 g by mouth daily as needed for mild constipation or moderate constipation.   prenatal multivitamin Tabs tablet Take 1 tablet by mouth at bedtime.   promethazine 25 MG tablet Commonly known as:  PHENERGAN Take 25 mg by mouth every 6 (six) hours as needed for nausea or vomiting.   sertraline 50 MG tablet Commonly known as:  ZOLOFT Take 50 mg by mouth at bedtime.       Diet: routine diet  Activity: Advance as tolerated. Pelvic rest for 6 weeks.   Outpatient follow up:1 week Follow up Appt:No future appointments. Follow up Visit:No Follow-up on file.     05/25/2016 Kenney HousemanNancy Jean Takoda Siedlecki, CNM

## 2016-05-25 NOTE — Progress Notes (Signed)
Patient ready for discharge. 

## 2016-06-23 ENCOUNTER — Encounter (HOSPITAL_COMMUNITY): Payer: Self-pay

## 2016-06-23 ENCOUNTER — Inpatient Hospital Stay (HOSPITAL_COMMUNITY): Payer: Medicaid Other | Admitting: Anesthesiology

## 2016-06-23 ENCOUNTER — Inpatient Hospital Stay (HOSPITAL_COMMUNITY)
Admission: AD | Admit: 2016-06-23 | Discharge: 2016-06-26 | DRG: 775 | Disposition: A | Discharge status: Home / Self Care | Payer: Medicaid Other | Source: Ambulatory Visit | Attending: Obstetrics and Gynecology | Admitting: Obstetrics and Gynecology

## 2016-06-23 ENCOUNTER — Inpatient Hospital Stay (HOSPITAL_COMMUNITY)
Admission: AD | Admit: 2016-06-23 | Discharge: 2016-06-23 | Disposition: A | Discharge status: Home / Self Care | Payer: Medicaid Other | Source: Ambulatory Visit | Attending: Obstetrics and Gynecology | Admitting: Obstetrics and Gynecology

## 2016-06-23 DIAGNOSIS — O4202 Full-term premature rupture of membranes, onset of labor within 24 hours of rupture: Principal | ICD-10-CM | POA: Diagnosis present

## 2016-06-23 DIAGNOSIS — K219 Gastro-esophageal reflux disease without esophagitis: Secondary | ICD-10-CM | POA: Diagnosis present

## 2016-06-23 DIAGNOSIS — O99344 Other mental disorders complicating childbirth: Secondary | ICD-10-CM | POA: Diagnosis present

## 2016-06-23 DIAGNOSIS — O99324 Drug use complicating childbirth: Secondary | ICD-10-CM | POA: Diagnosis present

## 2016-06-23 DIAGNOSIS — F1290 Cannabis use, unspecified, uncomplicated: Secondary | ICD-10-CM | POA: Diagnosis present

## 2016-06-23 DIAGNOSIS — F411 Generalized anxiety disorder: Secondary | ICD-10-CM | POA: Diagnosis present

## 2016-06-23 DIAGNOSIS — Z3A40 40 weeks gestation of pregnancy: Secondary | ICD-10-CM | POA: Diagnosis not present

## 2016-06-23 DIAGNOSIS — O9962 Diseases of the digestive system complicating childbirth: Secondary | ICD-10-CM | POA: Diagnosis present

## 2016-06-23 LAB — CBC
HCT: 37.3 % (ref 36.0–46.0)
HEMATOCRIT: 39 % (ref 36.0–46.0)
HEMOGLOBIN: 13.7 g/dL (ref 12.0–15.0)
Hemoglobin: 13.1 g/dL (ref 12.0–15.0)
MCH: 31.5 pg (ref 26.0–34.0)
MCH: 31.6 pg (ref 26.0–34.0)
MCHC: 35.1 g/dL (ref 30.0–36.0)
MCHC: 35.1 g/dL (ref 30.0–36.0)
MCV: 89.7 fL (ref 78.0–100.0)
MCV: 90.1 fL (ref 78.0–100.0)
PLATELETS: 141 10*3/uL — AB (ref 150–400)
Platelets: 144 10*3/uL — ABNORMAL LOW (ref 150–400)
RBC: 4.35 MIL/uL (ref 3.87–5.11)
RBC: 4.14 MIL/uL (ref 3.87–5.11)
RDW: 13.9 % (ref 11.5–15.5)
RDW: 13.9 % (ref 11.5–15.5)
WBC: 11.3 10*3/uL — AB (ref 4.0–10.5)
WBC: 17.5 10*3/uL — ABNORMAL HIGH (ref 4.0–10.5)

## 2016-06-23 LAB — TYPE AND SCREEN
ABO/RH(D): O POS
Antibody Screen: NEGATIVE

## 2016-06-23 LAB — COMPREHENSIVE METABOLIC PANEL
ALT: 23 U/L (ref 14–54)
AST: 25 U/L (ref 15–41)
Albumin: 3.1 g/dL — ABNORMAL LOW (ref 3.5–5.0)
Alkaline Phosphatase: 170 U/L — ABNORMAL HIGH (ref 38–126)
Anion gap: 12 (ref 5–15)
BUN: 7 mg/dL (ref 6–20)
CO2: 18 mmol/L — ABNORMAL LOW (ref 22–32)
Calcium: 9.3 mg/dL (ref 8.9–10.3)
Chloride: 103 mmol/L (ref 101–111)
Creatinine, Ser: 0.49 mg/dL (ref 0.44–1.00)
GFR calc Af Amer: >60 mL/min (ref >60)
GFR calc non Af Amer: >60 mL/min (ref >60)
Glucose, Bld: 90 mg/dL (ref 65–99)
Potassium: 4 mmol/L (ref 3.5–5.1)
Sodium: 133 mmol/L — ABNORMAL LOW (ref 135–145)
Total Bilirubin: 1 mg/dL (ref 0.3–1.2)
Total Protein: 6 g/dL — ABNORMAL LOW (ref 6.5–8.1)

## 2016-06-23 LAB — RAPID URINE DRUG SCREEN, HOSP PERFORMED
AMPHETAMINES: NOT DETECTED
Barbiturates: NOT DETECTED
Benzodiazepines: NOT DETECTED
COCAINE: NOT DETECTED
OPIATES: NOT DETECTED
Tetrahydrocannabinol: NOT DETECTED

## 2016-06-23 LAB — PROTEIN / CREATININE RATIO, URINE
Creatinine, Urine: 46 mg/dL
Protein Creatinine Ratio: 0.91 mg/mg{Cre} — ABNORMAL HIGH (ref 0.00–0.15)
Total Protein, Urine: 42 mg/dL

## 2016-06-23 LAB — POCT FERN TEST
POCT FERN TEST: NEGATIVE
POCT FERN TEST: POSITIVE

## 2016-06-23 LAB — CK: Total CK: 16 U/L — ABNORMAL LOW (ref 38–234)

## 2016-06-23 MED ORDER — SOD CITRATE-CITRIC ACID 500-334 MG/5ML PO SOLN: 30.0000 mL | ORAL | Status: DC | PRN

## 2016-06-23 MED ORDER — LIDOCAINE HCL (PF) 1 % IJ SOLN
30.0000 mL | INTRAMUSCULAR | Status: DC | PRN
  Filled 2016-06-23: qty 30

## 2016-06-23 MED ORDER — PHENYLEPHRINE 40 MCG/ML (10ML) SYRINGE FOR IV PUSH (FOR BLOOD PRESSURE SUPPORT)
80.0000 ug | PREFILLED_SYRINGE | INTRAVENOUS | Status: DC | PRN
  Filled 2016-06-23: qty 5
  Filled 2016-06-23: qty 10

## 2016-06-23 MED ORDER — OXYTOCIN 40 UNITS IN LACTATED RINGERS INFUSION - SIMPLE MED
1.0000 m[IU]/min | INTRAVENOUS | Status: DC
  Administered 2016-06-23: 2 m[IU]/min via INTRAVENOUS

## 2016-06-23 MED ORDER — FENTANYL 2.5 MCG/ML BUPIVACAINE 1/10 % EPIDURAL INFUSION (WH - ANES)
14.0000 mL/h | INTRAMUSCULAR | Status: DC | PRN
  Administered 2016-06-23: 14 mL/h via EPIDURAL
  Filled 2016-06-23: qty 100

## 2016-06-23 MED ORDER — LIDOCAINE HCL (PF) 1 % IJ SOLN
INTRAMUSCULAR | Status: DC | PRN
  Administered 2016-06-23: 5 mL via EPIDURAL
  Administered 2016-06-23: 2 mL via EPIDURAL
  Administered 2016-06-23: 3 mL via EPIDURAL

## 2016-06-23 MED ORDER — LACTATED RINGERS IV SOLN
INTRAVENOUS | Status: DC
  Administered 2016-06-23 (×3): via INTRAVENOUS

## 2016-06-23 MED ORDER — ACETAMINOPHEN 325 MG PO TABS: 650.0000 mg | ORAL_TABLET | ORAL | Status: DC | PRN

## 2016-06-23 MED ORDER — FENTANYL CITRATE (PF) 100 MCG/2ML IJ SOLN
100.0000 ug | INTRAMUSCULAR | Status: DC | PRN
  Administered 2016-06-23 (×4): 100 ug via INTRAVENOUS
  Filled 2016-06-23 (×4): qty 2

## 2016-06-23 MED ORDER — EPHEDRINE 5 MG/ML INJ
10.0000 mg | INTRAVENOUS | Status: DC | PRN
  Filled 2016-06-23: qty 4

## 2016-06-23 MED ORDER — TERBUTALINE SULFATE 1 MG/ML IJ SOLN
0.2500 mg | Freq: Once | INTRAMUSCULAR | Status: DC | PRN
  Filled 2016-06-23: qty 1

## 2016-06-23 MED ORDER — PHENYLEPHRINE 40 MCG/ML (10ML) SYRINGE FOR IV PUSH (FOR BLOOD PRESSURE SUPPORT)
80.0000 ug | PREFILLED_SYRINGE | INTRAVENOUS | Status: DC | PRN
  Administered 2016-06-23: 80 ug via INTRAVENOUS
  Filled 2016-06-23: qty 5
  Filled 2016-06-23: qty 10

## 2016-06-23 MED ORDER — LACTATED RINGERS IV SOLN
500.0000 mL | Freq: Once | INTRAVENOUS | Status: AC
  Administered 2016-06-23: 500 mL via INTRAVENOUS

## 2016-06-23 MED ORDER — OXYTOCIN BOLUS FROM INFUSION
500.0000 mL | Freq: Once | INTRAVENOUS | Status: AC
  Administered 2016-06-24: 500 mL via INTRAVENOUS

## 2016-06-23 MED ORDER — PROMETHAZINE HCL 25 MG/ML IJ SOLN
12.5000 mg | Freq: Four times a day (QID) | INTRAMUSCULAR | Status: DC | PRN
  Administered 2016-06-23: 12.5 mg via INTRAVENOUS
  Filled 2016-06-23: qty 1

## 2016-06-23 MED ORDER — DIPHENHYDRAMINE HCL 50 MG/ML IJ SOLN: 12.5000 mg | INTRAMUSCULAR | Status: DC | PRN

## 2016-06-23 MED ORDER — OXYTOCIN 40 UNITS IN LACTATED RINGERS INFUSION - SIMPLE MED
2.5000 [IU]/h | INTRAVENOUS | Status: DC
  Administered 2016-06-24: 2.5 [IU]/h via INTRAVENOUS

## 2016-06-23 MED ORDER — OXYCODONE-ACETAMINOPHEN 5-325 MG PO TABS: 2.0000 | ORAL_TABLET | ORAL | Status: DC | PRN

## 2016-06-23 MED ORDER — ONDANSETRON HCL 4 MG/2ML IJ SOLN: 4.0000 mg | Freq: Four times a day (QID) | INTRAMUSCULAR | Status: DC | PRN

## 2016-06-23 MED ORDER — ONDANSETRON HCL 4 MG/2ML IJ SOLN
4.0000 mg | INTRAMUSCULAR | Status: DC | PRN
  Administered 2016-06-23: 4 mg via INTRAVENOUS
  Filled 2016-06-23: qty 2

## 2016-06-23 MED ORDER — OXYCODONE-ACETAMINOPHEN 5-325 MG PO TABS: 1.0000 | ORAL_TABLET | ORAL | Status: DC | PRN

## 2016-06-23 MED ORDER — LACTATED RINGERS IV SOLN
500.0000 mL | INTRAVENOUS | Status: DC | PRN
  Administered 2016-06-23: 500 mL via INTRAVENOUS

## 2016-06-23 MED ORDER — LACTATED RINGERS IV SOLN: 500.0000 mL | Freq: Once | INTRAVENOUS | Status: DC

## 2016-06-23 NOTE — MAU Note (Signed)
Pt presents complaining of contractions every 1-2 minutes since 2130. Denies leaking or bleeding. Reports good fetal movement.

## 2016-06-23 NOTE — Anesthesia Procedure Notes (Signed)
Epidural Patient location during procedure: OB Start time: 06/23/2016 9:22 PM End time: 06/23/2016 9:27 PM  Staffing Anesthesiologist: Cecile HearingURK, Barak Bialecki EDWARD Performed: anesthesiologist   Preanesthetic Checklist Completed: patient identified, pre-op evaluation, timeout performed, IV checked, risks and benefits discussed and monitors and equipment checked  Epidural Patient position: sitting Prep: DuraPrep Patient monitoring: blood pressure and continuous pulse ox Approach: midline Location: L3-L4 Injection technique: LOR air  Needle:  Needle type: Tuohy  Needle gauge: 17 G Needle length: 9 cm Needle insertion depth: 4 cm Catheter size: 19 Gauge Catheter at skin depth: 9 cm Test dose: negative and Other (1% Lidocaine)  Additional Notes Patient identified.  Risk benefits discussed including failed block, incomplete pain control, headache, nerve damage, paralysis, blood pressure changes, nausea, vomiting, reactions to medication both toxic or allergic, and postpartum back pain.  Patient expressed understanding and wished to proceed.  All questions were answered.  Sterile technique used throughout procedure and epidural site dressed with sterile barrier dressing. No paresthesia or other complications noted. The patient did not experience any signs of intravascular injection such as tinnitus or metallic taste in mouth nor signs of intrathecal spread such as rapid motor block. Please see nursing notes for vital signs. Reason for block:procedure for pain

## 2016-06-23 NOTE — Progress Notes (Signed)
Laura PainHannah Frank is a 25 y.o. G2P0010 at 1040w0dadmitted for SROM and contractions Subjective:  Comfortable with  IV Frank meds Contractions every 2 minutes, lasting 60 seconds, intensity 5/10    Objective: BP (!) 163/94   Pulse 89   Temp 98.3 F (36.8 C) (Oral)   Resp 18   Ht 5\' 3"  (1.6 m)   Wt 120 lb (54.4 kg)   LMP 11/18/2014   BMI 21.26 kg/m  No intake/output data recorded. No intake/output data recorded.  FHT:  FHR: Cat 1 SVE:   Dilation: 3.5 Effacement (%): 90 Station: 0 Exam by:: L. Clemmons CNM  Labs: Lab Results  Component Value Date   WBC 17.5 (H) 06/23/2016   HGB 13.1 06/23/2016   HCT 37.3 06/23/2016   MCV 90.1 06/23/2016   PLT 144 (L) 06/23/2016    Assessment / Plan: Augmentation of labor, progressing well Fetal Wellbeing: reassuring Anticipated MOD:  NSVD GBS negative document printed to L/D Pre E labs ordered   Lawson FiscalLori A Clemmons 06/23/2016, 9:01 PM

## 2016-06-23 NOTE — Progress Notes (Signed)
RN asked Illene BolusLori Clemmons CNM for a hard copy of pt records to state GBS is negative.  Awaiting results.  Will continue to monitor.   Estanislado EmmsAshley Schwarz, RN BSN

## 2016-06-23 NOTE — Anesthesia Preprocedure Evaluation (Signed)
Anesthesia Evaluation  Patient identified by MRN, date of birth, ID band Patient awake    Reviewed: Allergy & Precautions, NPO status , Patient's Chart, lab work & pertinent test results  Airway Mallampati: II  TM Distance: >3 FB Neck ROM: Full    Dental  (+) Teeth Intact, Dental Advisory Given   Pulmonary neg pulmonary ROS,    Pulmonary exam normal breath sounds clear to auscultation       Cardiovascular Exercise Tolerance: Good negative cardio ROS Normal cardiovascular exam Rhythm:Regular Rate:Normal     Neuro/Psych negative neurological ROS     GI/Hepatic negative GI ROS, Neg liver ROS,   Endo/Other  negative endocrine ROS  Renal/GU negative Renal ROS     Musculoskeletal negative musculoskeletal ROS (+)   Abdominal   Peds  Hematology  (+) Blood dyscrasia (Plt 144k), ,   Anesthesia Other Findings Day of surgery medications reviewed with the patient.  Reproductive/Obstetrics (+) Pregnancy                             Anesthesia Physical Anesthesia Plan  ASA: II  Anesthesia Plan: Epidural   Post-op Pain Management:    Induction:   Airway Management Planned:   Additional Equipment:   Intra-op Plan:   Post-operative Plan:   Informed Consent: I have reviewed the patients History and Physical, chart, labs and discussed the procedure including the risks, benefits and alternatives for the proposed anesthesia with the patient or authorized representative who has indicated his/her understanding and acceptance.   Dental advisory given  Plan Discussed with:   Anesthesia Plan Comments: (Patient identified. Risks/Benefits/Options discussed with patient including but not limited to bleeding, infection, nerve damage, paralysis, failed block, incomplete pain control, headache, blood pressure changes, nausea, vomiting, reactions to medication both or allergic, itching and postpartum back  pain. Confirmed with bedside nurse the patient's most recent platelet count. Confirmed with patient that they are not currently taking any anticoagulation, have any bleeding history or any family history of bleeding disorders. Patient expressed understanding and wished to proceed. All questions were answered. )        Anesthesia Quick Evaluation

## 2016-06-23 NOTE — Progress Notes (Signed)
Notified of pt arrival in MAU and exam. Will discharge home with labor precautions  

## 2016-06-23 NOTE — Anesthesia Pain Management Evaluation Note (Signed)
  [facility]NA Pain Management Visit Note  Patient: Laura Frank, 25 y.o., female  "Hello I am a [facility] of the [facility] at [facility]. We have an anesthesia team available at all times to provide care throughout the hospital, including epidural management and anesthesia for C-section. I don't know your plan for the delivery whether it a natural birth, water birth, IV sedation, nitrous supplementation, doula or epidural, but we want to meet your pain goals."   1.Was your pain managed to your expectations on prior hospitalizations?   No prior hospitalizations  2.What is your expectation for pain management during this hospitalization?     Epidural and IV pain meds.  3.How can we help you reach that goal?     Explain my options for pain control while in labor .  Record the patient's initial score and the patient's pain goal.   Pain: 1  Pain Goal: 8 [facility] wants you to be able to say your pain was always managed very well.  Taraneh Metheney 06/23/2016

## 2016-06-23 NOTE — MAU Note (Signed)
Pt reports she has been having increased ctx since about 4 am. Reports some pink clear/ red liquid mucusy discharge. Good fetal movement

## 2016-06-23 NOTE — Discharge Instructions (Signed)
Third Trimester of Pregnancy °The third trimester is from week 29 through week 40 (months 7 through 9). The third trimester is a time when the unborn baby (fetus) is growing rapidly. At the end of the ninth month, the fetus is about 20 inches in length and weighs 6-10 pounds. °Body changes during your third trimester °Your body goes through many changes during pregnancy. The changes vary from woman to woman. During the third trimester: °· Your weight will continue to increase. You can expect to gain 25-35 pounds (11-16 kg) by the end of the pregnancy. °· You may begin to get stretch marks on your hips, abdomen, and breasts. °· You may urinate more often because the fetus is moving lower into your pelvis and pressing on your bladder. °· You may develop or continue to have heartburn. This is caused by increased hormones that slow down muscles in the digestive tract. °· You may develop or continue to have constipation because increased hormones slow digestion and cause the muscles that push waste through your intestines to relax. °· You may develop hemorrhoids. These are swollen veins (varicose veins) in the rectum that can itch or be painful. °· You may develop swollen, bulging veins (varicose veins) in your legs. °· You may have increased body aches in the pelvis, back, or thighs. This is due to weight gain and increased hormones that are relaxing your joints. °· You may have changes in your hair. These can include thickening of your hair, rapid growth, and changes in texture. Some women also have hair loss during or after pregnancy, or hair that feels dry or thin. Your hair will most likely return to normal after your baby is born. °· Your breasts will continue to grow and they will continue to become tender. A yellow fluid (colostrum) may leak from your breasts. This is the first milk you are producing for your baby. °· Your belly button may stick out. °· You may notice more swelling in your hands, face, or  ankles. °· You may have increased tingling or numbness in your hands, arms, and legs. The skin on your belly may also feel numb. °· You may feel short of breath because of your expanding uterus. °· You may have more problems sleeping. This can be caused by the size of your belly, increased need to urinate, and an increase in your body's metabolism. °· You may notice the fetus "dropping," or moving lower in your abdomen. °· You may have increased vaginal discharge. °· Your cervix becomes thin and soft (effaced) near your due date. °What to expect at prenatal visits °You will have prenatal exams every 2 weeks until week 36. Then you will have weekly prenatal exams. During a routine prenatal visit: °· You will be weighed to make sure you and the fetus are growing normally. °· Your blood pressure will be taken. °· Your abdomen will be measured to track your baby's growth. °· The fetal heartbeat will be listened to. °· Any test results from the previous visit will be discussed. °· You may have a cervical check near your due date to see if you have effaced. °At around 36 weeks, your health care provider will check your cervix. At the same time, your health care provider will also perform a test on the secretions of the vaginal tissue. This test is to determine if a type of bacteria, Group B streptococcus, is present. Your health care provider will explain this further. °Your health care provider may ask you: °·   What your birth plan is. °· How you are feeling. °· If you are feeling the baby move. °· If you have had any abnormal symptoms, such as leaking fluid, bleeding, severe headaches, or abdominal cramping. °· If you are using any tobacco products, including cigarettes, chewing tobacco, and electronic cigarettes. °· If you have any questions. °Other tests or screenings that may be performed during your third trimester include: °· Blood tests that check for low iron levels (anemia). °· Fetal testing to check the health,  activity level, and growth of the fetus. Testing is done if you have certain medical conditions or if there are problems during the pregnancy. °· Nonstress test (NST). This test checks the health of your baby to make sure there are no signs of problems, such as the baby not getting enough oxygen. During this test, a belt is placed around your belly. The baby is made to move, and its heart rate is monitored during movement. °What is false labor? °False labor is a condition in which you feel small, irregular tightenings of the muscles in the womb (contractions) that eventually go away. These are called Braxton Hicks contractions. Contractions may last for hours, days, or even weeks before true labor sets in. If contractions come at regular intervals, become more frequent, increase in intensity, or become painful, you should see your health care provider. °What are the signs of labor? °· Abdominal cramps. °· Regular contractions that start at 10 minutes apart and become stronger and more frequent with time. °· Contractions that start on the top of the uterus and spread down to the lower abdomen and back. °· Increased pelvic pressure and dull back pain. °· A watery or bloody mucus discharge that comes from the vagina. °· Leaking of amniotic fluid. This is also known as your "water breaking." It could be a slow trickle or a gush. Let your doctor know if it has a color or strange odor. °If you have any of these signs, call your health care provider right away, even if it is before your due date. °Follow these instructions at home: °Eating and drinking °· Continue to eat regular, healthy meals. °· Do not eat: °¨ Raw meat or meat spreads. °¨ Unpasteurized milk or cheese. °¨ Unpasteurized juice. °¨ Store-made salad. °¨ Refrigerated smoked seafood. °¨ Hot dogs or deli meat, unless they are piping hot. °¨ More than 6 ounces of albacore tuna a week. °¨ Shark, swordfish, king mackerel, or tile fish. °¨ Store-made salads. °¨ Raw  sprouts, such as mung bean or alfalfa sprouts. °· Take prenatal vitamins as told by your health care provider. °· Take 1000 mg of calcium daily as told by your health care provider. °· If you develop constipation: °¨ Take over-the-counter or prescription medicines. °¨ Drink enough fluid to keep your urine clear or pale yellow. °¨ Eat foods that are high in fiber, such as fresh fruits and vegetables, whole grains, and beans. °¨ Limit foods that are high in fat and processed sugars, such as fried and sweet foods. °Activity °· Exercise only as directed by your health care provider. Healthy pregnant women should aim for 2 hours and 30 minutes of moderate exercise per week. If you experience any pain or discomfort while exercising, stop. °· Avoid heavy lifting. °· Do not exercise in extreme heat or humidity, or at high altitudes. °· Wear low-heel, comfortable shoes. °· Practice good posture. °· Do not travel far distances unless it is absolutely necessary and only with the approval   of your health care provider. °· Wear your seat belt at all times while in a car, on a bus, or on a plane. °· Take frequent breaks and rest with your legs elevated if you have leg cramps or low back pain. °· Do not use hot tubs, steam rooms, or saunas. °· You may continue to have sex unless your health care provider tells you otherwise. °Lifestyle °· Do not use any products that contain nicotine or tobacco, such as cigarettes and e-cigarettes. If you need help quitting, ask your health care provider. °· Do not drink alcohol. °· Do not use any medicinal herbs or unprescribed drugs. These chemicals affect the formation and growth of the baby. °· If you develop varicose veins: °¨ Wear support pantyhose or compression stockings as told by your healthcare provider. °¨ Elevate your feet for 15 minutes, 3-4 times a day. °· Wear a supportive maternity bra to help with breast tenderness. °General instructions °· Take over-the-counter and prescription  medicines only as told by your health care provider. There are medicines that are either safe or unsafe to take during pregnancy. °· Take warm sitz baths to soothe any pain or discomfort caused by hemorrhoids. Use hemorrhoid cream or witch hazel if your health care provider approves. °· Avoid cat litter boxes and soil used by cats. These carry germs that can cause birth defects in the baby. If you have a cat, ask someone to clean the litter box for you. °· To prepare for the arrival of your baby: °¨ Take prenatal classes to understand, practice, and ask questions about the labor and delivery. °¨ Make a trial run to the hospital. °¨ Visit the hospital and tour the maternity area. °¨ Arrange for maternity or paternity leave through employers. °¨ Arrange for family and friends to take care of pets while you are in the hospital. °¨ Purchase a rear-facing car seat and make sure you know how to install it in your car. °¨ Pack your hospital bag. °¨ Prepare the baby’s nursery. Make sure to remove all pillows and stuffed animals from the baby's crib to prevent suffocation. °· Visit your dentist if you have not gone during your pregnancy. Use a soft toothbrush to brush your teeth and be gentle when you floss. °· Keep all prenatal follow-up visits as told by your health care provider. This is important. °Contact a health care provider if: °· You are unsure if you are in labor or if your water has broken. °· You become dizzy. °· You have mild pelvic cramps, pelvic pressure, or nagging pain in your abdominal area. °· You have lower back pain. °· You have persistent nausea, vomiting, or diarrhea. °· You have an unusual or bad smelling vaginal discharge. °· You have pain when you urinate. °Get help right away if: °· You have a fever. °· You are leaking fluid from your vagina. °· You have spotting or bleeding from your vagina. °· You have severe abdominal pain or cramping. °· You have rapid weight loss or weight gain. °· You have  shortness of breath with chest pain. °· You notice sudden or extreme swelling of your face, hands, ankles, feet, or legs. °· Your baby makes fewer than 10 movements in 2 hours. °· You have severe headaches that do not go away with medicine. °· You have vision changes. °Summary °· The third trimester is from week 29 through week 40, months 7 through 9. The third trimester is a time when the unborn baby (fetus)   is growing rapidly. °· During the third trimester, your discomfort may increase as you and your baby continue to gain weight. You may have abdominal, leg, and back pain, sleeping problems, and an increased need to urinate. °· During the third trimester your breasts will keep growing and they will continue to become tender. A yellow fluid (colostrum) may leak from your breasts. This is the first milk you are producing for your baby. °· False labor is a condition in which you feel small, irregular tightenings of the muscles in the womb (contractions) that eventually go away. These are called Braxton Hicks contractions. Contractions may last for hours, days, or even weeks before true labor sets in. °· Signs of labor can include: abdominal cramps; regular contractions that start at 10 minutes apart and become stronger and more frequent with time; watery or bloody mucus discharge that comes from the vagina; increased pelvic pressure and dull back pain; and leaking of amniotic fluid. °This information is not intended to replace advice given to you by your health care provider. Make sure you discuss any questions you have with your health care provider. °Document Released: 06/14/2001 Document Revised: 11/26/2015 Document Reviewed: 08/21/2012 °Elsevier Interactive Patient Education © 2017 [employer] °Introduction °Patient Name: ________________________________________________ Patient Due Date: ____________________ °What is a fetal movement count? °A fetal movement count is the number of times that you feel your baby  move during a certain amount of time. This may also be called a fetal kick count. A fetal movement count is recommended for every pregnant woman. You may be asked to start counting fetal movements as early as week 28 of your pregnancy. °Pay attention to when your baby is most active. You may notice your baby's sleep and wake cycles. You may also notice things that make your baby move more. You should do a fetal movement count: °· When your baby is normally most active. °· At the same time each day. °A good time to count movements is while you are resting, after having something to eat and drink. °How do I count fetal movements? °1. Find a quiet, comfortable area. Sit, or lie down on your side. °2. Write down the date, the start time and stop time, and the number of movements that you felt between those two times. Take this information with you to your health care visits. °3. For 2 hours, count kicks, flutters, swishes, rolls, and jabs. You should feel at least 10 movements during 2 hours. °4. You may stop counting after you have felt 10 movements. °5. If you do not feel 10 movements in 2 hours, have something to eat and drink. Then, keep resting and counting for 1 hour. If you feel at least 4 movements during that hour, you may stop counting. °Contact a health care provider if: °· You feel fewer than 4 movements in 2 hours. °· Your baby is not moving like he or she usually does. °Date: ____________ Start time: ____________ Stop time: ____________ Movements: ____________ °Date: ____________ Start time: ____________ Stop time: ____________ Movements: ____________ °Date: ____________ Start time: ____________ Stop time: ____________ Movements: ____________ °Date: ____________ Start time: ____________ Stop time: ____________ Movements: ____________ °Date: ____________ Start time: ____________ Stop time: ____________ Movements: ____________ °Date: ____________ Start time: ____________ Stop time: ____________ Movements:  ____________ °Date: ____________ Start time: ____________ Stop time: ____________ Movements: ____________ °Date: ____________ Start time: ____________ Stop time: ____________ Movements: ____________ °Date: ____________ Start time: ____________ Stop time: ____________ Movements: ____________ °This information is not intended to replace   advice given to you by your health care provider. Make sure you discuss any questions you have with your health care provider. °Document Released: 07/20/2006 Document Revised: 02/17/2016 Document Reviewed: 07/30/2015 °Elsevier Interactive Patient Education © 2017 [employer] °Braxton Hicks Contractions °Contractions of the uterus can occur throughout pregnancy. Contractions are not always a sign that you are in labor.  °WHAT ARE BRAXTON HICKS CONTRACTIONS?  °Contractions that occur before labor are called Braxton Hicks contractions, or false labor. Toward the end of pregnancy (32-34 weeks), these contractions can develop more often and may become more forceful. This is not true labor because these contractions do not result in opening (dilatation) and thinning of the cervix. They are sometimes difficult to tell apart from true labor because these contractions can be forceful and people have different pain tolerances. You should not feel embarrassed if you go to the hospital with false labor. Sometimes, the only way to tell if you are in true labor is for your health care provider to look for changes in the cervix. °If there are no prenatal problems or other health problems associated with the pregnancy, it is completely safe to be sent home with false labor and await the onset of true labor. °HOW CAN YOU TELL THE DIFFERENCE BETWEEN TRUE AND FALSE LABOR? °False Labor  °· The contractions of false labor are usually shorter and not as hard as those of true labor.   °· The contractions are usually irregular.   °· The contractions are often felt in the front of the lower abdomen and in  the groin.   °· The contractions may go away when you walk around or change positions while lying down.   °· The contractions get weaker and are shorter lasting as time goes on.   °· The contractions do not usually become progressively stronger, regular, and closer together as with true labor.   °True Labor  °· Contractions in true labor last 30-70 seconds, become very regular, usually become more intense, and increase in frequency.   °· The contractions do not go away with walking.   °· The discomfort is usually felt in the top of the uterus and spreads to the lower abdomen and low back.   °· True labor can be determined by your health care provider with an exam. This will show that the cervix is dilating and getting thinner.   °WHAT TO REMEMBER °· Keep up with your usual exercises and follow other instructions given by your health care provider.   °· Take medicines as directed by your health care provider.   °· Keep your regular prenatal appointments.   °· Eat and drink lightly if you think you are going into labor.   °· If Braxton Hicks contractions are making you uncomfortable:   °¨ Change your position from lying down or resting to walking, or from walking to resting.   °¨ Sit and rest in a tub of warm water.   °¨ Drink 2-3 glasses of water. Dehydration may cause these contractions.   °¨ Do slow and deep breathing several times an hour.   °WHEN SHOULD I SEEK IMMEDIATE MEDICAL CARE? °Seek immediate medical care if: °· Your contractions become stronger, more regular, and closer together.   °· You have fluid leaking or gushing from your vagina.   °· You have a fever.   °· You pass blood-tinged mucus.   °· You have vaginal bleeding.   °· You have continuous abdominal pain.   °· You have low back pain that you never had before.   °· You feel your baby's head pushing down and causing pelvic pressure.   °· Your   baby is not moving as much as it used to.   °This information is not intended to replace advice given to you  by your health care provider. Make sure you discuss any questions you have with your health care provider. °Document Released: 06/20/2005 Document Revised: 10/12/2015 Document Reviewed: 04/01/2013 °Elsevier Interactive Patient Education © 2017 [employer] ° °

## 2016-06-23 NOTE — H&P (Signed)
Laura PainHannah Frank is a 25 y.o. female presenting for SROM clear at 4 am and contractions. OB History    Gravida Para Term Preterm AB Living   2       1 0   SAB TAB Ectopic Multiple Live Births     1           Past Medical History:  Diagnosis Date  . Cyclic vomiting syndrome 01/05/2015   marijuana induced emesis  . Generalized anxiety disorder   . GERD (gastroesophageal reflux disease)   . Marijuana use    daily  . Panic attacks    Past Surgical History:  Procedure Laterality Date  . NO PAST SURGERIES     Family History: family history is not on file. Social History:  reports that she has never smoked. She has never used smokeless tobacco. She reports that she uses drugs, including Marijuana. She reports that she does not drink alcohol.     Maternal Diabetes: No Genetic Screening: Normal Maternal Ultrasounds/Referrals: Normal Fetal Ultrasounds or other Referrals:  None Maternal Substance Abuse:  Yes:  Type: Marijuana Significant Maternal Medications:  Meds include: Zoloft Significant Maternal Lab Results:  Lab values include: Other:  Other Comments:  elevated LFTS which resolved  ROS History Dilation: 2 Effacement (%): 80 Station: -1 Exam by:: [email or web address] Blood pressure 135/77, pulse 87, temperature 98.6 F (37 C), temperature source Oral, resp. rate 18, height 5\' 3"  (1.6 m), weight 120 lb 1.9 oz (54.5 kg), last menstrual period 11/18/2014, unknown if currently breastfeeding. Exam Physical Exam  Prenatal labs: ABO, Rh: --/--/O POS (12/21 1047) Antibody: NEG (12/21 1047) Rubella:   RPR:    HBsAg: Negative (11/21 1047)  HIV:    GBS:     Assessment/Plan: Term with SROM Cat 1 tracing GBS neg Pt desires to start pitocin at two pm if contractions remained spaced out Will check UDS.  Pt states she has not used THC in over two months Plan IV meds for Frank Anticipate SVD   Laura Frank A 06/23/2016, 12:52 PM

## 2016-06-23 NOTE — Progress Notes (Signed)
Patient ID: Laura Frank, female   DOB: January 29, 1991, 25 y.o.   MRN: [id number]  Pt comfortable with one dose of fentanyl Cat 1 tracing toco q 2minutes on 4 mu pitocin cx 2-3/90/-1 Continue current care

## 2016-06-23 NOTE — Progress Notes (Signed)
Dr Normand Sloopillard reported to myself that patient was GBS negative. Requested copy of results as they were missing from current copy of pre-natal records in the chart.

## 2016-06-24 ENCOUNTER — Encounter (HOSPITAL_COMMUNITY): Payer: Self-pay

## 2016-06-24 LAB — RPR: RPR Ser Ql: NONREACTIVE

## 2016-06-24 MED ORDER — ONDANSETRON HCL 4 MG PO TABS: 4.0000 mg | ORAL_TABLET | ORAL | Status: DC | PRN

## 2016-06-24 MED ORDER — SIMETHICONE 80 MG PO CHEW: 80.0000 mg | CHEWABLE_TABLET | ORAL | Status: DC | PRN

## 2016-06-24 MED ORDER — DIBUCAINE 1 % RE OINT: 1.0000 "application " | TOPICAL_OINTMENT | RECTAL | Status: DC | PRN

## 2016-06-24 MED ORDER — TETANUS-DIPHTH-ACELL PERTUSSIS 5-2.5-18.5 LF-MCG/0.5 IM SUSP
0.5000 mL | Freq: Once | INTRAMUSCULAR | Status: AC
  Administered 2016-06-25: 0.5 mL via INTRAMUSCULAR
  Filled 2016-06-24: qty 0.5

## 2016-06-24 MED ORDER — DIPHENHYDRAMINE HCL 25 MG PO CAPS: 25.0000 mg | ORAL_CAPSULE | Freq: Four times a day (QID) | ORAL | Status: DC | PRN

## 2016-06-24 MED ORDER — ACETAMINOPHEN 325 MG PO TABS: 650.0000 mg | ORAL_TABLET | ORAL | Status: DC | PRN

## 2016-06-24 MED ORDER — IBUPROFEN 600 MG PO TABS
600.0000 mg | ORAL_TABLET | Freq: Four times a day (QID) | ORAL | Status: DC
  Administered 2016-06-24 – 2016-06-26 (×9): 600 mg via ORAL
  Filled 2016-06-24 (×10): qty 1

## 2016-06-24 MED ORDER — WITCH HAZEL-GLYCERIN EX PADS: 1.0000 "application " | MEDICATED_PAD | CUTANEOUS | Status: DC | PRN

## 2016-06-24 MED ORDER — COCONUT OIL OIL: 1.0000 "application " | TOPICAL_OIL | Status: DC | PRN

## 2016-06-24 MED ORDER — SENNOSIDES-DOCUSATE SODIUM 8.6-50 MG PO TABS
2.0000 | ORAL_TABLET | ORAL | Status: DC
  Administered 2016-06-24 – 2016-06-26 (×2): 2 via ORAL
  Filled 2016-06-24 (×2): qty 2

## 2016-06-24 MED ORDER — PRENATAL MULTIVITAMIN CH
1.0000 | ORAL_TABLET | Freq: Every day | ORAL | Status: DC
  Administered 2016-06-24 – 2016-06-26 (×2): 1 via ORAL
  Filled 2016-06-24 (×3): qty 1

## 2016-06-24 MED ORDER — SERTRALINE HCL 50 MG PO TABS
50.0000 mg | ORAL_TABLET | Freq: Every day | ORAL | Status: DC
  Administered 2016-06-24 – 2016-06-26 (×2): 50 mg via ORAL
  Filled 2016-06-24 (×3): qty 1

## 2016-06-24 MED ORDER — ZOLPIDEM TARTRATE 5 MG PO TABS: 5.0000 mg | ORAL_TABLET | Freq: Every evening | ORAL | Status: DC | PRN

## 2016-06-24 MED ORDER — BENZOCAINE-MENTHOL 20-0.5 % EX AERO
1.0000 "application " | INHALATION_SPRAY | CUTANEOUS | Status: DC | PRN
  Filled 2016-06-24: qty 56

## 2016-06-24 MED ORDER — OXYCODONE-ACETAMINOPHEN 5-325 MG PO TABS
1.0000 | ORAL_TABLET | ORAL | Status: DC | PRN
  Administered 2016-06-24: 1 via ORAL
  Filled 2016-06-24: qty 1

## 2016-06-24 MED ORDER — ONDANSETRON HCL 4 MG/2ML IJ SOLN: 4.0000 mg | INTRAMUSCULAR | Status: DC | PRN

## 2016-06-24 NOTE — Anesthesia Postprocedure Evaluation (Signed)
Anesthesia Post Note  Patient: Laura Frank  Procedure(s) Performed: * No procedures listed *  Patient location during evaluation: Mother Baby Anesthesia Type: Epidural Level of consciousness: awake and alert Pain management: pain level controlled Vital Signs Assessment: post-procedure vital signs reviewed and stable Respiratory status: spontaneous breathing, nonlabored ventilation and respiratory function stable Cardiovascular status: stable Postop Assessment: no headache, no backache and epidural receding Anesthetic complications: no        Last Vitals:  Vitals:   06/24/16 0430 06/24/16 0620  BP: 123/75 113/63  Pulse: 82 96  Resp: 20 20  Temp: 37.1 C 37.1 C    Last Pain:  Vitals:   06/24/16 0620  TempSrc: Oral  PainSc: 0-No pain   Pain Goal: Patients Stated Pain Goal: 0 (06/23/16 0929)               Junious SilkGILBERT,Symphonie Schneiderman

## 2016-06-24 NOTE — Progress Notes (Signed)
UR chart review completed.  

## 2016-06-24 NOTE — Lactation Note (Signed)
This note was copied from a baby'Frank chart. Lactation Consultation Note  Patient Name: [name] Date: 06/24/2016 Reason for consult: Initial assessment Breastfeeding consultation services and support information given and reviewed.  This is mom'Frank first baby and newborn is 809 hours old.  Mom states the RN from the nursery has assisted her with feedings.  She states her nipples are flat and a nipple shield has been helpful.  Reviewed basics and answered questions.  Encouraged to call with concerns/assist.  Maternal Data Has patient been taught Hand Expression?: Yes Does the patient have breastfeeding experience prior to this delivery?: No  Feeding    LATCH Score/Interventions                      Lactation Tools Discussed/Used     Consult Status Consult Status: Follow-up Date: 06/25/16 Follow-up type: In-patient    Laura Frank, Laura Frank 06/24/2016, 11:38 AM

## 2016-06-25 MED ORDER — HYDROXYZINE HCL 25 MG PO TABS
25.0000 mg | ORAL_TABLET | Freq: Three times a day (TID) | ORAL | Status: DC | PRN
  Administered 2016-06-25: 25 mg via ORAL
  Filled 2016-06-25 (×2): qty 1

## 2016-06-25 NOTE — Progress Notes (Signed)
Subjective: Postpartum Day 1: Vaginal delivery,  laceration Patient up ad lib, reports no syncope or dizziness.  Pt has history of anxiety and had a panic attack due to baby being fussy.  Pt currently on Zoloft and took Vistaril at home for attacks.  Has seen counselor in past.  After lactation saw patient, pt felt better. Feeding: breast Contraceptive plan:  undecided  Objective: Vital signs in last 24 hours: Temp:  [97.9 F (36.6 C)-98.5 F (36.9 C)] 97.9 F (36.6 C) (12/23 [id number]) Pulse Rate:  [68-82] 68 (12/23 0638) Resp:  [18] 18 (12/23 [id number]) BP: (106-125)/(75-91) 125/81 (12/23 [id number])  Physical Exam:  General: alert, cooperative and no distress Lochia: appropriate Uterine Fundus: firm Perineum: healing well DVT Evaluation: No evidence of DVT seen on physical exam. Negative Homan's sign.   CBC Latest Ref Rng & Units 06/23/2016 06/23/2016 05/24/2016  WBC 4.0 - 10.5 K/uL 17.5(H) 11.3(H) 11.4(H)  Hemoglobin 12.0 - 15.0 g/dL 47.813.1 29.513.7 11.9(L)  Hematocrit 36.0 - 46.0 % 37.3 39.0 33.8(L)  Platelets 150 - 400 K/uL 144(L) 141(L) 186     Assessment/Plan: Status post vaginal delivery day 1. Anxiety disorder Stable Continue current care. Plan for discharge tomorrow, Breastfeeding and Lactation consult  Discussed with patient seeing counselor in next week.  Pt agreed and will make appt.  Discussed increasing Zoloft.  Pt states will wait because feeling better.  Has help at home.    Henderson Newcomerancy Jean ProtheroCNM 06/25/2016, 10:14 AM

## 2016-06-25 NOTE — Lactation Note (Signed)
This note was copied from a baby's chart. Lactation Consultation Note Follow up visit at 39 hours.  RN requesting assist with feedings.  Mom reports continuing to have trouble getting baby latched. Parents changing baby's diaper with baby fussy and kicking.  LC allowed baby to suck on gloved finger.  Baby does not extend tongue past lower gumline, he does not cup finger with tongue, he is tongue thrusting at gloved finger.  Lc was able to feel a tight short frenulum under tongue, but was not able to visualize attachement at this time.  Baby has disorganized suck, then with proper finger placement baby holds finger or doesn't close mouth for good suction.    Mom is able to apply #16 nipple shield.  Mom noted to have a lot of hair around areola, and wide spaced breasts.  Mom does report +breast changes during pregnancy with larger breast size.    Baby is very fussy and stiffens legs and kicks at the breasts.  First attempted football, unable to get baby in proper position due to extending legs forcefully.  LC assisted with laid back and baby again is stiffening and extending legs even with assist to "frog" legs onto mom.  Baby is very fussy.  Mom remains calm and reassuring to baby.  Baby will not suck on nipple shield or moms nipple, but will suck on moms finger.  LC expressed and supplemented by syringe about 1ml of EBM.  Baby tolerated well.  Lc discussed using supplement to have a larger volume due to poor feedings at this time.  Mom agreeable to using allimentum.    Baby tolerated 10mls of allimentum syringe finger feeding well.  LC attempted to transition baby to NS and baby again refusing, more content and sleepy.    Plan is for mom to feed with early cues, or wake baby as needed.  Offer supplement as appetizer to wake baby as needed.  Attempt latch and feeding at the breast.  Supplement with syringe at breast if baby tolerates.  Mom to post pump and increase volume of supplements at least 10mls at  each feeding today (8X24 hrs.) up to 15-5320mls as tolerated.  Mom also aware to limit feeding attempts as baby is working hard and fussy in latch attempts.    FOB and visitors at bedside supportive.  LC to reports to RN, Selena BattenKim.   Patient Name: Boy Leotis PainHannah Schlup ZOXWR'UToday's Date: 06/25/2016 Reason for consult: Follow-up assessment;Difficult latch   Maternal Data Has patient been taught Hand Expression?: Yes  Feeding Feeding Type: Breast Fed Length of feed:  (few sucks, no pattern)  LATCH Score/Interventions Latch: Repeated attempts needed to sustain latch, nipple held in mouth throughout feeding, stimulation needed to elicit sucking reflex. Intervention(s): Skin to skin;Teach feeding cues;Waking techniques Intervention(s): Breast massage;Breast compression  Audible Swallowing: None  Type of Nipple: Flat  Comfort (Breast/Nipple): Soft / non-tender     Hold (Positioning): Assistance needed to correctly position infant at breast and maintain latch. Intervention(s): Breastfeeding basics reviewed;Support Pillows;Position options;Skin to skin  LATCH Score: 5  Lactation Tools Discussed/Used     Consult Status Consult Status: Follow-up Date: 06/26/16 Follow-up type: In-patient    Beverely RisenShoptaw, Arvella MerlesJana Lynn 06/25/2016, 5:42 PM

## 2016-06-25 NOTE — Clinical Social Work Maternal (Addendum)
CLINICAL SOCIAL WORK MATERNAL/CHILD NOTE  Patient Details  Name: Laura Frank MRN: [id number] Date of Birth: 10/24/1990  Date:  06/25/2016  Clinical Social Worker Initiating Note:  Roseanna Koplin, MSW, LCSW-A   Date/ Time Initiated:  06/25/16/1534              Child's Name:  Laura Frank    Legal Guardian:  Other (Comment) (Not established by court system; MOB and FOB parenting collectively )   Need for Interpreter:  None   Date of Referral:  06/24/16     Reason for Referral:  Current Substance Use/Substance Use During Pregnancy    Referral Source:  Central Nursery   Address:  [address] [place], [place] [address]  Phone number:  [phone]   Household Members: Self, Significant Other   Natural Supports (not living in the home): Extended Family, Immediate Family, Friends, Parent   Professional Supports:Therapist   Employment:Unemployed   Type of Work: Unemployed    Education:  9 to 11 years   Financial Resources:Medicaid   Other Resources: WIC, Food Stamps    Cultural/Religious Considerations Which May Impact Care: None reported.   Strengths: Ability to meet basic needs , Compliance with medical plan , Home prepared for child , Pediatrician chosen  ([facility] )   Risk Factors/Current Problems: Substance Use    Cognitive State: Alert , Able to Concentrate , Goal Oriented    Mood/Affect: Comfortable , Calm , Interested    CSW Assessment:CSW met with MOB at bedside to complete assessment. At this time, CSW was accompanied by maternal grandmother. With MOB's permission, this writer explained role and reasoning for visit being due to MOB's hx of substance use during pregnancy. This writer explained hospitals policy and procedure regarding substance and mandatory reporting. MOB was informed that babys UDS is (-) negative; however, cord blood is still pending. At this time, MOB verbalized  understanding.   MOB notes she is currently taking SSRI's to help manage anxiety. MOB was additionally able to identify coping skills she uses to help calm herself down when anxiety gets worse. She notes her support system is a big help. This writer reviewed SIDS and PPD. MOB verbalized understanding.   Upon further assessment, MOB denies any additional needs at this time. CSW will continue to follow pending cord blood test results.   CSW Plan/Description: No Further Intervention Required/No Barriers to Discharge, Other (Comment) (CSW will continue to follow pending cord blood test )    Nadeen Shipman, MSW, LCSW-A Clinical Social Worker  [facility]  Office: [phone]  

## 2016-06-25 NOTE — Lactation Note (Signed)
This note was copied from a baby's chart. Lactation Consultation Note Anxious mom having difficulty latching. Baby arching, frantic, screaming. Mom had cried earlier d/t baby crying. RN getting mom medication from Dr. For anxiety. Mom took Zoloft during pregnancy, L1 to take during BF. Vistaril  Ordered to take now, L2.  Baby so frantic LC had a hard time getting baby to suckle in gloved finger. Hand expressed drops of colostrum, put into #16 NS and rubbed colostrum on outter NS for latching. Finally got baby latched. Wouldn't suckle on breast, just held into mouth calmly and fell asleep after 10 min. Mom calmed. Baby in football hold, got mom to deep breath for relaxation. Positioners for moms hand to keep baby cheek to breast. A lot of teaching of positioning, holding breast and baby's head. Encouraged mom not to overstimulate baby and relax/. Hand express colostrum and use curve tip syring into NS instead of spoon feeding for next feeding and rub colostrum on #16 NS. Fitted mom for #16 NS instead of #20. Taught application. Shells given to evert short shaft nipples more. Mom has cone shaped breast and small bulbous areolas w/small compressible nipples. LC feels that if baby was BF well that he would be able to suckle on mom's breast w/o NS. Attempted to latch w/o NS, was in frantic state at that time. May try later when BF well.  Mom shown how to use DEBP & how to disassemble, clean, & reassemble parts. Mom knows to pump q3h for 15-20 min. Mom has long black hair around areola's. Has breast tissue w/2 fingers width between breast. Mom had increase in breast during pregnancy.  Spoke w/Pedicatric Md about baby BF.  Patient Name: Boy [name] ZOXWR'UToday's Date: 06/25/2016 Reason for consult: Follow-up assessment;Difficult latch   Maternal Data    Feeding Feeding Type: Breast Milk Length of feed: 0 min  LATCH Score/Interventions Latch: Too sleepy or reluctant, no latch achieved, no sucking  elicited. Intervention(s): Teach feeding cues;Waking techniques Intervention(s): Adjust position;Assist with latch;Breast massage;Breast compression  Audible Swallowing: None Intervention(s): Skin to skin;Hand expression Intervention(s): Alternate breast massage  Type of Nipple: Everted at rest and after stimulation Intervention(s): Shells;Double electric pump  Comfort (Breast/Nipple): Soft / non-tender     Hold (Positioning): Full assist, staff holds infant at breast Intervention(s): Breastfeeding basics reviewed;Support Pillows;Position options;Skin to skin  LATCH Score: 4  Lactation Tools Discussed/Used Tools: Shells;Nipple Dorris CarnesShields;Pump Nipple shield size: 16 Shell Type: Inverted Breast pump type: Double-Electric Breast Pump WIC Program: No Pump Review: Setup, frequency, and cleaning;Milk Storage Initiated by:: Peri JeffersonL. Tajha Sammarco RN IBCLC Date initiated:: 06/25/16   Consult Status Consult Status: Follow-up Date: 06/25/16 Follow-up type: In-patient    Charyl DancerCARVER, Briell Paulette G 06/25/2016, 9:13 AM

## 2016-06-26 MED ORDER — PRENATAL MULTIVITAMIN CH: 1.0000 | ORAL_TABLET | Freq: Every day | ORAL | 2 refills | Status: DC

## 2016-06-26 NOTE — Discharge Summary (Signed)
OB Discharge Summary     Patient Name: Laura Frank DOB: October 28, 1990 MRN: [id number]  Date of admission: 06/23/2016 Delivering MD: Illene BolusLEMMONS, LORI A   Date of discharge: 06/26/2016  Admitting diagnosis: 40WKS,CTX Intrauterine pregnancy: 1051w1d     Secondary diagnosis:  Active Problems:   Normal labor   Indication for care in labor or delivery  Additional problems: None     Discharge diagnosis: Term Pregnancy Delivered                                                                                                Post partum procedures:None  Augmentation: None  Complications: None  Hospital course:  Onset of Labor With Vaginal Delivery     25 y.o. yo G2P1011 at 551w1d was admitted in Active Labor on 06/23/2016. Patient had an uncomplicated labor course as follows:  Membrane Rupture Time/Date: 4:00 AM ,06/23/2016   Intrapartum Procedures: Episiotomy: None [1]                                         Lacerations:  None [1]  Patient had a delivery of a Viable infant. 06/24/2016  Information for the patient's newborn:  Guadalupe MapleSwaringen, Boy Delena [[id number]][[id number]]  Delivery Method: Vaginal, Spontaneous Delivery (Filed from Delivery Summary)    Pateint had an uncomplicated postpartum course.  She is ambulating, tolerating a regular diet, passing flatus, and urinating well. Patient is discharged home in stable condition on 06/26/16.    Physical exam Vitals:   06/24/16 1837 06/25/16 0638 06/25/16 1824 06/26/16 0527  BP: (!) 117/91 125/81 120/75 115/61  Pulse: 82 68 73 74  Resp: 18 18 18 18   Temp: 98 F (36.7 C) 97.9 F (36.6 C) 98.5 F (36.9 C) 98.1 F (36.7 C)  TempSrc: Axillary Oral Oral   Weight:      Height:       General: alert, cooperative and no distress Lochia: appropriate Uterine Fundus: firm Incision: N/A DVT Evaluation: No evidence of DVT seen on physical exam. Negative Homan's sign. Labs: Lab Results  Component Value Date   WBC 17.5 (H) 06/23/2016   HGB 13.1  06/23/2016   HCT 37.3 06/23/2016   MCV 90.1 06/23/2016   PLT 144 (L) 06/23/2016   CMP Latest Ref Rng & Units 06/23/2016  Glucose 65 - 99 mg/dL 90  BUN 6 - 20 mg/dL 7  Creatinine 2.130.44 - 0.861.00 mg/dL 5.780.49  Sodium 469135 - 629145 mmol/L 133(L)  Potassium 3.5 - 5.1 mmol/L 4.0  Chloride 101 - 111 mmol/L 103  CO2 22 - 32 mmol/L 18(L)  Calcium 8.9 - 10.3 mg/dL 9.3  Total Protein 6.5 - 8.1 g/dL 6.0(L)  Total Bilirubin 0.3 - 1.2 mg/dL 1.0  Alkaline Phos 38 - 126 U/L 170(H)  AST 15 - 41 U/L 25  ALT 14 - 54 U/L 23    Discharge instruction: per After Visit Summary and "Baby and Me Booklet".  After visit meds:  Ibuprofen, Zoloft, PNV  Diet: routine diet  Activity: Advance  as tolerated. Pelvic rest for 6 weeks.   Outpatient follow up:6 weeks Follow up Appt:No future appointments. Follow up Visit:No Follow-up on file.  Postpartum contraception: Undecided  Newborn Data: Live born female  Birth Weight: 6 lb 10.5 oz (3020 g) APGAR: 8, 9  Baby Feeding: Breast Disposition:rooming in   06/26/2016 Laura Frank, CNM

## 2016-06-26 NOTE — Lactation Note (Signed)
This note was copied from a baby's chart. Lactation Consultation Note LC to f/u w/mom. Mom walking around w/top off. Fixing to clean DEBP. Mom had pump colostrum. She very excited. Baby calm sleeping in crib. Mom stated baby's last 3 Bf good w/formula in NS and spoon feeding colostrum.  Mom feels things are doing better. Mom hands may have been a slight bit shaky? She was moving them fast. No shaking when pressure on cabinet top.  Will get staff or LC to see latch. Patient Name: Laura [name] WUJWJ'XToday's Date: 06/26/2016 Reason for consult: Follow-up assessment;Difficult latch   Maternal Data    Feeding    LATCH Score/Interventions       Type of Nipple: Everted at rest and after stimulation Intervention(s): Double electric pump  Comfort (Breast/Nipple): Filling, red/small blisters or bruises, mild/mod discomfort  Problem noted: Mild/Moderate discomfort Interventions (Mild/moderate discomfort): Breast shields;Post-pump;Hand massage;Hand expression        Lactation Tools Discussed/Used     Consult Status Consult Status: Follow-up Date: 06/26/16 Follow-up type: In-patient    Charyl DancerCARVER, [name] 06/26/2016, 2:53 PM

## 2016-06-27 ENCOUNTER — Ambulatory Visit: Payer: Self-pay

## 2016-06-27 NOTE — Lactation Note (Signed)
This note was copied from a baby's chart. Lactation Consultation Note  Patient Name: Laura [name] ZOXWR'UToday's Date: 06/27/2016 Reason for consult: Follow-up assessment   With this mom of a term baby, now 7179 hours old. Mom is having panic attacks when she tries to latch the baby, so has decided to pump and bottle feed. Mom has a DEP at home, which dad is going to bring in for mom to see if it is as efficient as the symphany. Mom [facility]. I will speak to mom prior to discharge tday.    Maternal Data    Feeding    LATCH Score/Interventions                      Lactation Tools Discussed/Used     Consult Status Consult Status: Complete Follow-up type: Call as needed    Laura Frank, Laura Frank 06/27/2016, 9:49 AM
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
## 2016-11-17 IMAGING — US US OB COMP LESS 14 WK
1 series · 14 of 28 positions shown · non-contrast
Comparison: None.

CLINICAL DATA: Lower abdominal pain, positive pregnancy test, last
normal menstrual period November 18, 2014.

EXAM:
OBSTETRIC <14 WK US AND TRANSVAGINAL OB US
TECHNIQUE: Both transabdominal and transvaginal ultrasound examinations were
performed for complete evaluation of the gestation as well as the
maternal uterus, adnexal regions, and pelvic cul-de-sac.
Transvaginal technique was performed to assess early pregnancy.

[Series 1: us ob comp less 14 wk · 0.21mm/px · 14 of 74 slices shown]
[im 3/74]
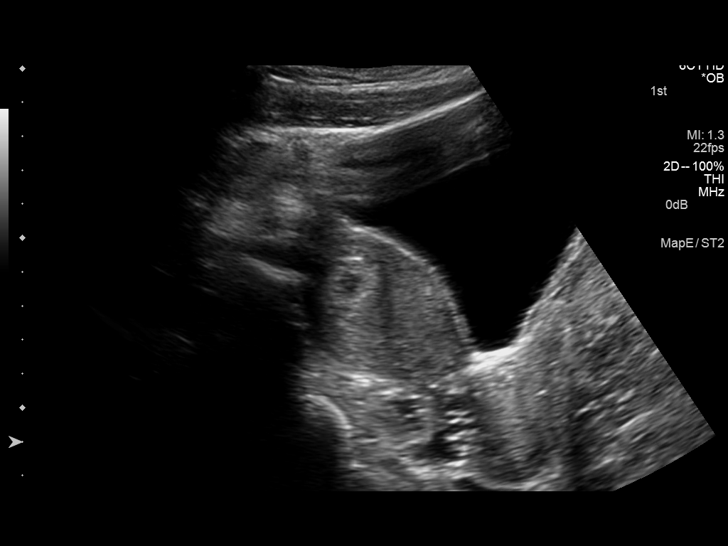
[im 9/74]
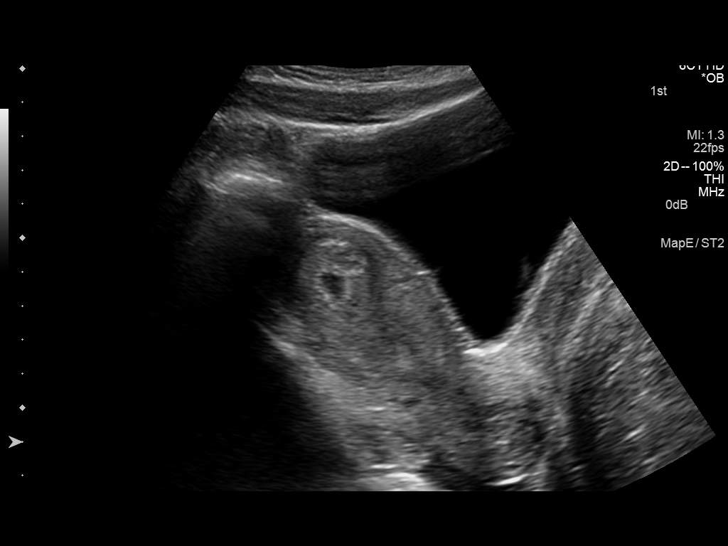
[im 14/74]
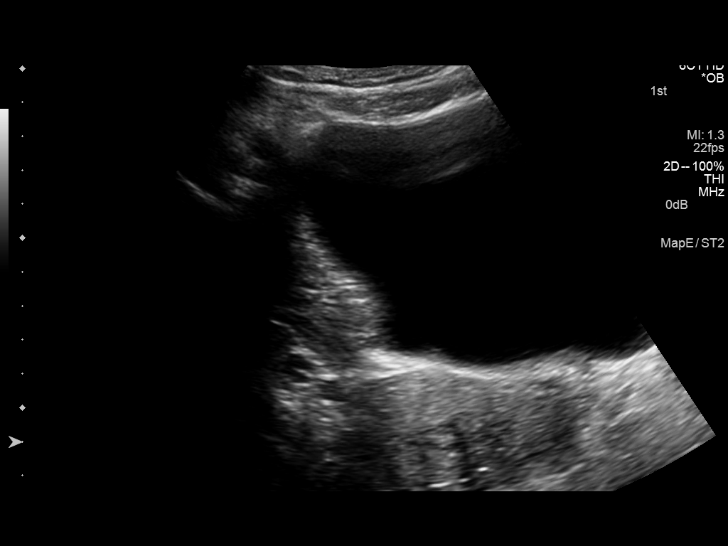
[im 19/74]
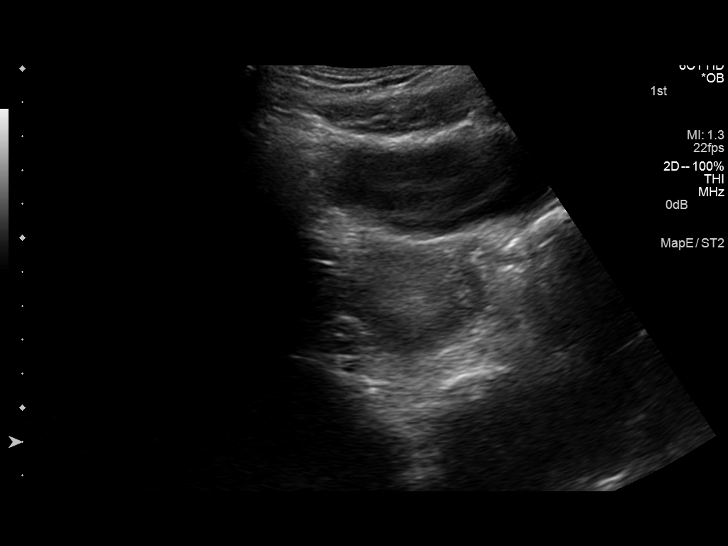
[im 25/74]
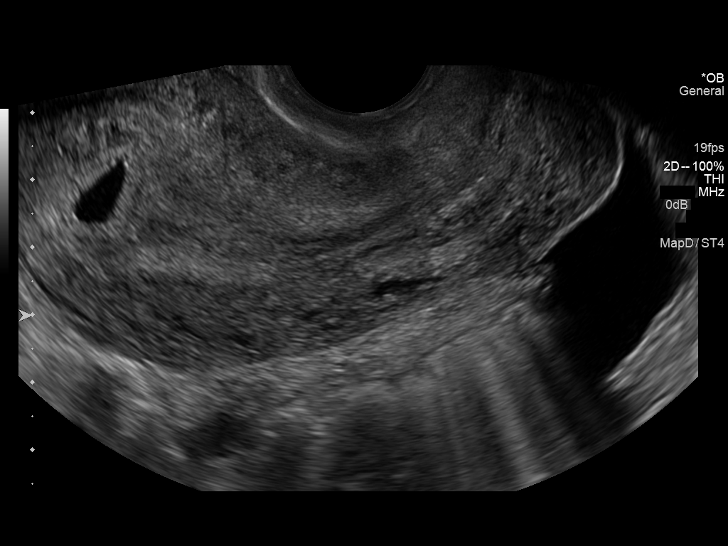
[im 30/74]
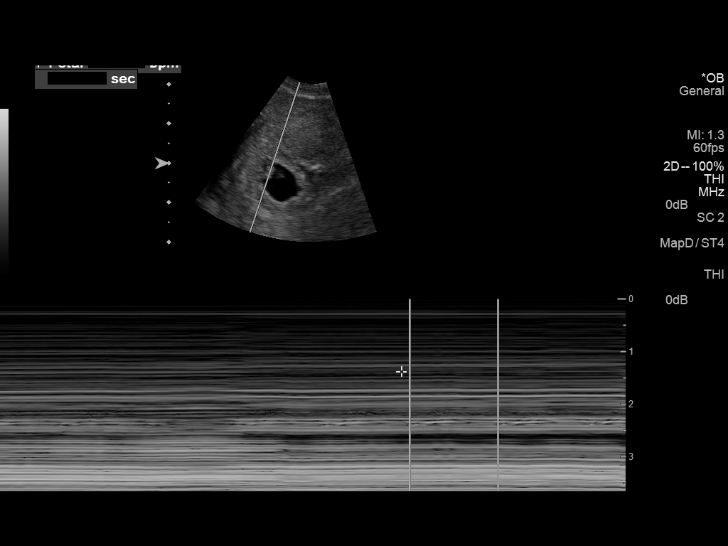
[im 36/74]
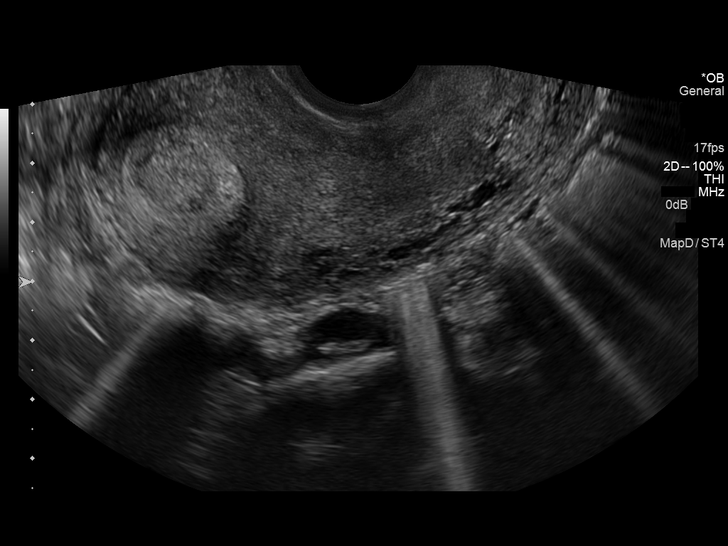
[im 41/74]
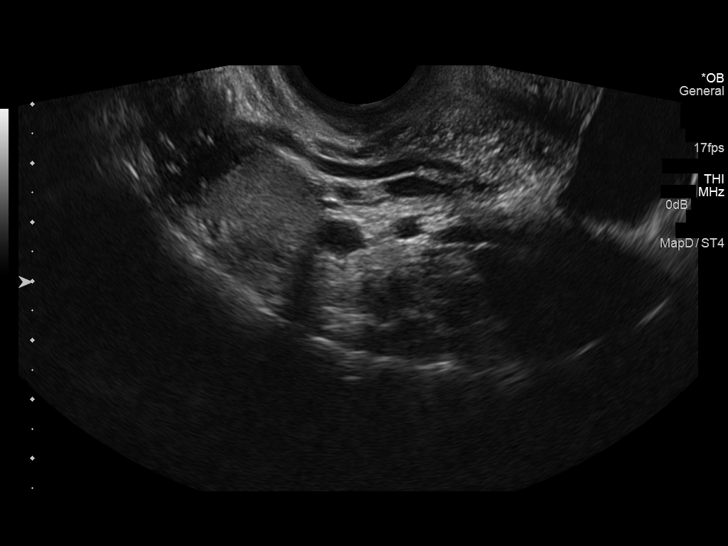
[im 46/74]
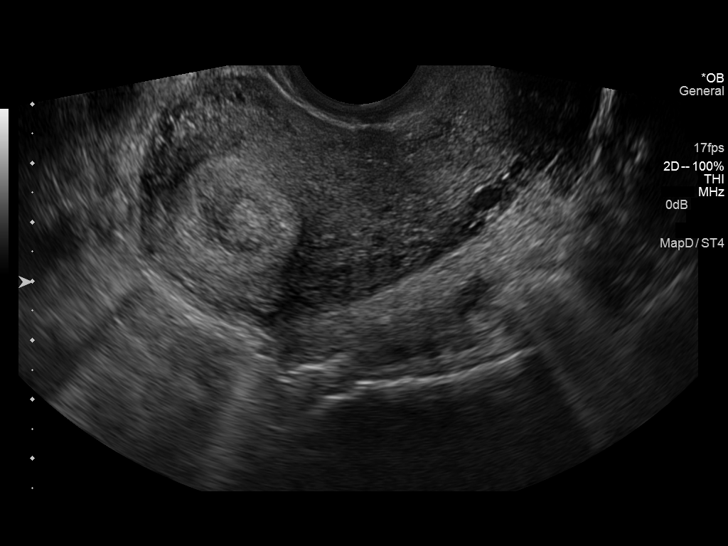
[im 52/74]
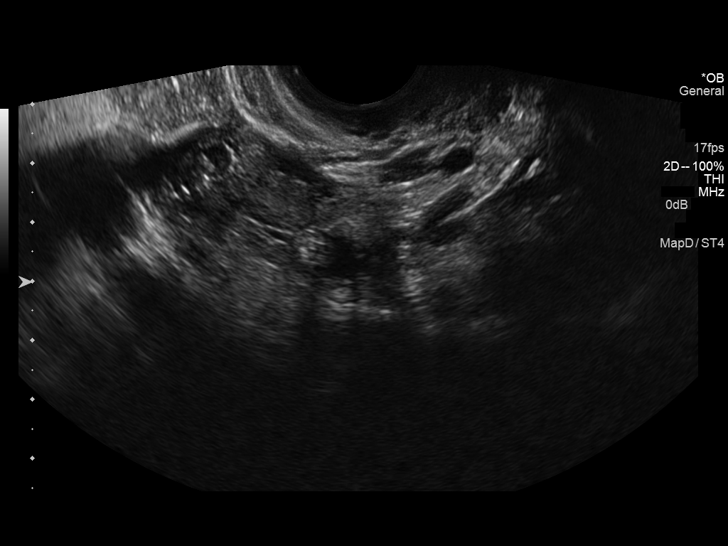
[im 57/74]
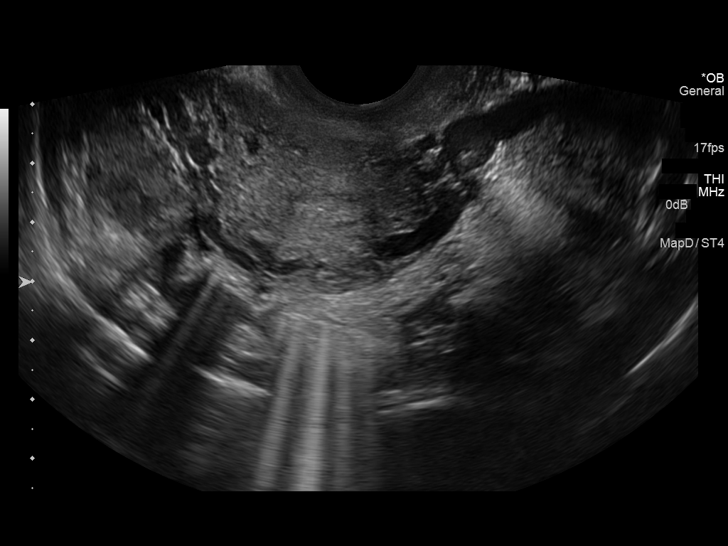
[im 63/74]
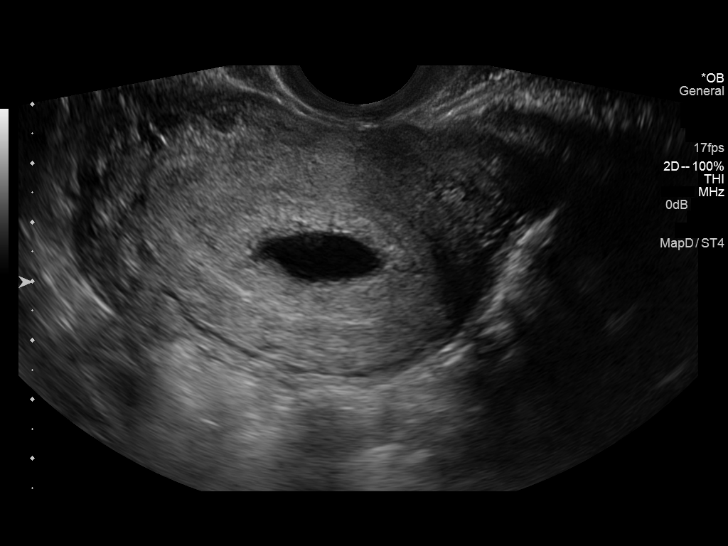
[im 68/74]
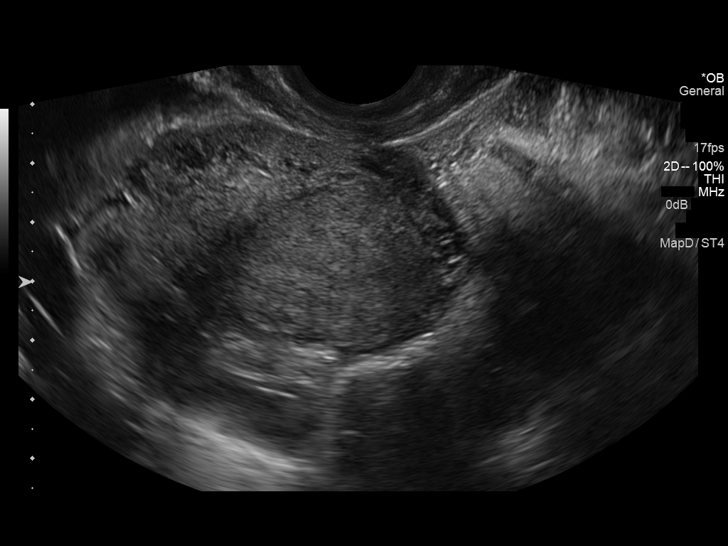
[im 74/74]
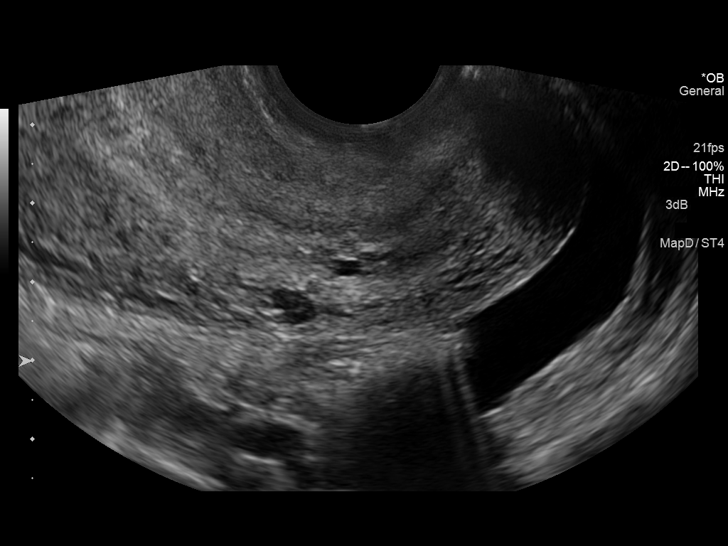

[14 of 28 positions shown; findings below may reference images not displayed]

FINDINGS: Intrauterine gestational sac: Single common normal in shape

Yolk sac:  Present

Embryo:  Present

Cardiac Activity: Present

Heart Rate: 116  bpm

CRL:  3.5  mm   6 w   0 d                  US EDC: August 27, 2015

Maternal uterus/adnexae: No subchorionic hemorrhage is observed. The
maternal ovaries are unremarkable.
IMPRESSION: There is a normal appearing early IUP with estimated gestational age
of 6 weeks 0 days with estimated date of confinement [DATE],

## 2017-04-13 ENCOUNTER — Ambulatory Visit: Payer: Medicaid Other | Admitting: Student

## 2017-05-04 ENCOUNTER — Encounter: Payer: Self-pay | Admitting: Licensed Clinical Social Worker

## 2017-05-04 ENCOUNTER — Ambulatory Visit (INDEPENDENT_AMBULATORY_CARE_PROVIDER_SITE_OTHER): Payer: Medicaid Other | Admitting: Student

## 2017-05-04 ENCOUNTER — Encounter: Payer: Self-pay | Admitting: Student

## 2017-05-04 DIAGNOSIS — Z87898 Personal history of other specified conditions: Secondary | ICD-10-CM

## 2017-05-04 DIAGNOSIS — F410 Panic disorder [episodic paroxysmal anxiety] without agoraphobia: Secondary | ICD-10-CM | POA: Insufficient documentation

## 2017-05-04 DIAGNOSIS — F411 Generalized anxiety disorder: Secondary | ICD-10-CM | POA: Diagnosis not present

## 2017-05-04 DIAGNOSIS — F1291 Cannabis use, unspecified, in remission: Secondary | ICD-10-CM | POA: Insufficient documentation

## 2017-05-04 MED ORDER — SERTRALINE HCL 25 MG PO TABS: 25.0000 mg | ORAL_TABLET | Freq: Every day | ORAL | 0 refills | Status: DC

## 2017-05-04 NOTE — Patient Instructions (Signed)
It was great seeing you today! We have addressed the following issues today  1. Anxiety: We have started Zoloft at 25 mg daily.  Please pick up this prescription and start taking today.  I recommend follow-up in 2 weeks.   If we did any lab work today, and the results require attention, either me or my nurse will get in touch with you. If everything is normal, you will get a letter in mail and a message via . If you don't hear from us in two weeks, please give us a call. Otherwise, we look forward to seeing you again at your next visit. If you have any questions or concerns before then, please call the clinic at [phone].  Please bring all your medications to every doctors visit  Sign up for My Chart to have easy access to your labs results, and communication with your Primary care physician.    Please check-out at the front desk before leaving the clinic.    Take Care,   Dr. Alanda SlimGonfa
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
## 2017-05-05 LAB — TSH: TSH: 3.32 u[IU]/mL (ref 0.450–4.500)

## 2017-05-05 LAB — VITAMIN D 25 HYDROXY (VIT D DEFICIENCY, FRACTURES): VIT D 25 HYDROXY: 32.6 ng/mL (ref 30.0–100.0)

## 2017-05-05 NOTE — Progress Notes (Signed)
ESTIMATE TIME:15 minutes Type of Service: Integrated Behavioral Health warm handoff  Interpreter:No.   SUBJECTIVE: Laura Frank is a 26 y.o. female referred by Dr. Alanda Frank for: managing symptoms of  anxiety  See Dr. Sundra Frank's note for hx and details LIFE CONTEXT:  Family & Social:patient lives with Laura Frank and 7510 month old son. 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  INTERVENTION: , Reflective listening, Behavioral Therapy (Relaxed breathing), Psychoeducation ; Brief CBT   GAD-7=18,Severity indication of : severe anxiety.  GAD 7 : Generalized Anxiety Score 05/05/2017  Nervous, Anxious, on Edge 3  Control/stop worrying 3  Worry too much - different things 3  Trouble relaxing 2  Restless 2  Easily annoyed or irritable 3  Afraid - awful might happen 2  Total GAD 7 Score 18  Anxiety Difficulty Very difficult   ISSUES DISCUSSED: Integrated care services, support system, previous and current coping skills, community support utilized, Automatic Negative Thoughts, thinking and feeling and review or relaxed breathing.    ASSESSMENT:Patient currently experiencing symptoms of anxiety.  Symptoms have become unmanageable causing significant impairment in her daily function at work and home. Patient may benefit from, and is in agreement to receive further assessment and brief therapeutic interventions to assist with managing symptoms.  PLAN:   1.Patient will F/U with Integrated Behavioral Health after next office visit with PCP  2. Behavioral recommendations: relaxed breathing and thought log  3. [facility]   4.  Start Zoloft per PCP   Warm Hand Off Completed.     Laura Hineseborah Deniel Mcquiston, LCSW Licensed Clinical Social Worker [facility]   [phone] 9:15  AM

## 2017-05-30 ENCOUNTER — Other Ambulatory Visit: Payer: Self-pay

## 2017-05-30 ENCOUNTER — Ambulatory Visit: Payer: Medicaid Other | Admitting: Student

## 2017-05-30 ENCOUNTER — Encounter: Payer: Self-pay | Admitting: Student

## 2017-05-30 DIAGNOSIS — F411 Generalized anxiety disorder: Secondary | ICD-10-CM | POA: Diagnosis present

## 2017-05-30 MED ORDER — SERTRALINE HCL 50 MG PO TABS: 50.0000 mg | ORAL_TABLET | Freq: Every day | ORAL | 0 refills | Status: DC

## 2017-05-30 NOTE — Patient Instructions (Signed)
It was great seeing you today! We have addressed the following issues today  Anxiety: I am glad you start feeling better.  We have increased the Zoloft to 50 mg daily.  I anticipate this to help more.  I suggest follow-up in 2 months or sooner if you have concern.  If we did any lab work today, and the results require attention, either me or my nurse will get in touch with you. If everything is normal, you will get a letter in mail and a message via . If you don't hear from us in two weeks, please give us a call. Otherwise, we look forward to seeing you again at your next visit. If you have any questions or concerns before then, please call the clinic at [phone].  Please bring all your medications to every doctors visit  Sign up for My Chart to have easy access to your labs results, and communication with your Primary care physician.    Please check-out at the front desk before leaving the clinic.    Take Care,   Dr. Alanda SlimGonfa

## 2017-05-30 NOTE — Progress Notes (Signed)
Subjective:    Laura Frank is a 26 y.o. old female here for follow-up on anxiety  HPI  Patient reports improvement in intensity and frequency of her symptoms except for some rare and situational occasions.  She reports some stressors.  She says her father-in-law is difficult to deal with.  She also reported that his son is cutting some teeth and is fussy.  She says he has been on and her mother help us with caring for his son.  She reports taking his Zoloft regularly.  She denies missing doses.  She denies any problem with the medication.   PMH/Problem List: has Generalized anxiety disorder and History of marijuana use on their problem list.   has a past medical history of Cyclic vomiting syndrome (01/05/2015), Generalized anxiety disorder, Marijuana use, and Panic attacks.  FH:  Family History  Problem Relation Age of Onset  . Alcohol abuse Neg Hx   . Arthritis Neg Hx   . Asthma Neg Hx   . Birth defects Neg Hx   . Cancer Neg Hx   . COPD Neg Hx   . Depression Neg Hx   . Diabetes Neg Hx   . Drug abuse Neg Hx   . Early death Neg Hx   . Hearing loss Neg Hx   . Heart disease Neg Hx   . Hyperlipidemia Neg Hx   . Hypertension Neg Hx   . Kidney disease Neg Hx   . Learning disabilities Neg Hx   . Mental illness Neg Hx   . Mental retardation Neg Hx   . Miscarriages / Stillbirths Neg Hx   . Stroke Neg Hx   . Vision loss Neg Hx   . Varicose Veins Neg Hx     SH Social History   Tobacco Use  . Smoking status: Never Smoker  . Smokeless tobacco: Never Used  Substance Use Topics  . Alcohol use: No  . Drug use: Yes    Types: Marijuana    Comment: not currently    Review of Systems Review of systems negative except for pertinent positives and negatives in history of present illness above.     Objective:     Vitals:   05/30/17 1354  BP: (!) 102/58  Pulse: (!) 58  Temp: 98.2 F (36.8 C)  TempSrc: Oral  SpO2: 97%  Weight: 144 lb 6.4 oz (65.5 kg)  Height: 5\' 3"  (1.6 m)    Body mass index is 25.58 kg/m.  Physical Exam GEN: appears well, no ditress RESP: No IWOB, CTAB CVS: RRR, normal S1&S2, no murmurs NEURO: Grossly intact PSYCH: normal affect GAD 7 : Generalized Anxiety Score 05/30/2017 05/05/2017  Nervous, Anxious, on Edge 2 3  Control/stop worrying 2 3  Worry too much - different things 2 3  Trouble relaxing 2 2  Restless 1 2  Easily annoyed or irritable 2 3  Afraid - awful might happen 1 2  Total GAD 7 Score 12 18  Anxiety Difficulty - Very difficult   [facility] 05/30/2017 05/30/2017 05/04/2017 02/16/2016  Decreased Interest 0 0 0 3  Down, Depressed, Hopeless 0 0 0 3  PHQ - 2 Score 0 0 0 6  Altered sleeping 1 - - -  Tired, decreased energy 1 - - -  Change in appetite 1 - - -  Feeling bad or failure about yourself  0 - - -  Trouble concentrating 1 - - -  Moving slowly or fidgety/restless 1 - - -  Suicidal thoughts  0 - - -  PHQ-9 Score 5 - - -   Assessment and Plan:  1. Generalized anxiety disorder: Improved.  She is tolerating Zoloft.  We discussed about continuing 25 mg or increasingly to 50 mg for better outcome.  She prefers the later saying she has used the 50 mg when she was pregnant.  Sent prescription for 50 mg to her pharmacy. [facility].  She declined today but states she would call if she needs  Return in about 2 months (around 07/30/2017) for Anxiety.  Almon Herculesaye T Gurnoor Sloop, MD 05/30/17 Pager: [phone]

## 2017-06-25 ENCOUNTER — Other Ambulatory Visit: Payer: Self-pay | Admitting: Student

## 2017-06-25 DIAGNOSIS — F411 Generalized anxiety disorder: Secondary | ICD-10-CM

## 2017-06-29 MED ORDER — SERTRALINE HCL 50 MG PO TABS: 50.0000 mg | ORAL_TABLET | Freq: Every day | ORAL | 0 refills | Status: DC

## 2017-07-13 ENCOUNTER — Encounter: Payer: Self-pay | Admitting: Family Medicine

## 2017-07-13 ENCOUNTER — Other Ambulatory Visit: Payer: Self-pay

## 2017-07-13 ENCOUNTER — Ambulatory Visit (INDEPENDENT_AMBULATORY_CARE_PROVIDER_SITE_OTHER): Payer: Self-pay | Admitting: Family Medicine

## 2017-07-13 VITALS — BP 102/60 | HR 77 | Temp 97.5°F | Wt 151.0 lb

## 2017-07-13 DIAGNOSIS — J069 Acute upper respiratory infection, unspecified: Secondary | ICD-10-CM

## 2017-07-13 NOTE — Assessment & Plan Note (Addendum)
Acute.  Consistent with viral infection.  No signs of secondary bacterial infection.  Approximately 1 week into illness, anticipate improvement over the next week.  Has been using Sudafed for the past 5 days.  Would be cautious about use given possible rebound congestion. - Recommending conservative management with frequent hydration, NSAIDs and Tylenol for pain, and avoidance of Sudafed - Discussed reasons to return for antibiotics

## 2017-07-13 NOTE — Patient Instructions (Signed)
Thank you for coming in to see us today. Please see below to review our plan for today's visit.  1.  Your symptoms are consistent with a viral URI.  If you develop a fever (>100.4F), worsening sinus pain on one side, or symptoms do not improve by next week, please return to the clinic for evaluation. 2.  I would stop the Sudafed decongestion.  You can take Mucinex if your congestion continues to be a problem.  Drink plenty of water.  Take ibuprofen as needed for pain.  I would expect her symptoms to improve over the next week.  Please call the clinic at [phone] if your symptoms worsen or you have any concerns. It was our pleasure to serve you.  Durward Parcelavid Christobal Morado, DO [facility], PGY-2

## 2017-07-13 NOTE — Progress Notes (Signed)
   Subjective   Patient ID: Laura PainHannah Oliveria    DOB: [birth year], 27 y.o. female   MRN: [id number]  CC: "Sinus pain"  HPI: Laura PainHannah Podolak is a 27 y.o. female who presents for a same day appointment for the following:  URI  Has been sick for 7 days. 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  ROS: see HPI for pertinent.  PMFSH: GAD, THC use. Smoking status reviewed. Medications reviewed.  Objective   BP 102/60   Pulse 77   Temp (!) 97.5 F (36.4 C) (Oral)   Wt 151 lb (68.5 kg)   SpO2 98%   BMI 26.75 kg/m  Vitals and nursing note reviewed.  General: well nourished, well developed, NAD with non-toxic appearance HEENT: normocephalic, atraumatic, moist mucous membranes, no oral lesions or dental carries present, nonedematous tonsils, mildly erythematous conjunctivae bilaterally, nasal sinuses nontender on palpation, nares mildly erythematous with clear discharge bilaterally Neck: supple, non-tender without lymphadenopathy Cardiovascular: regular rate and rhythm without murmurs, rubs, or gallops Lungs: clear to auscultation bilaterally with normal work of breathing Skin: warm, dry, no rashes or lesions, cap refill < 2 seconds Extremities: warm and well perfused, normal tone, no edema  Assessment & Plan   Viral URI Acute.  Consistent with viral infection.  No signs of secondary bacterial infection.  Approximately 1 week into illness, anticipate improvement over the next week.  Has been using Sudafed for the past 5  days.  Would be cautious about use given possible rebound congestion. - Recommending conservative management with frequent hydration, NSAIDs and Tylenol for pain, and avoidance of Sudafed - Discussed reasons to return for antibiotics  No orders of the defined types were placed in this encounter.  No orders of the defined types were placed in this encounter.   Durward Parcelavid McMullen, DO [facility], PGY-2 07/14/2017, 9:56 AM

## 2017-08-04 ENCOUNTER — Encounter: Payer: Self-pay | Admitting: Student

## 2017-10-23 ENCOUNTER — Ambulatory Visit: Payer: Self-pay | Admitting: Psychology

## 2017-10-23 ENCOUNTER — Encounter: Payer: Self-pay | Admitting: Student

## 2017-10-23 ENCOUNTER — Other Ambulatory Visit: Payer: Self-pay

## 2017-10-23 ENCOUNTER — Ambulatory Visit (INDEPENDENT_AMBULATORY_CARE_PROVIDER_SITE_OTHER): Payer: Self-pay | Admitting: Student

## 2017-10-23 VITALS — BP 98/56 | HR 67 | Temp 98.4°F | Ht 63.0 in | Wt 145.0 lb

## 2017-10-23 DIAGNOSIS — F39 Unspecified mood [affective] disorder: Secondary | ICD-10-CM

## 2017-10-23 DIAGNOSIS — F3162 Bipolar disorder, current episode mixed, moderate: Secondary | ICD-10-CM | POA: Insufficient documentation

## 2017-10-23 MED ORDER — DIVALPROEX SODIUM ER 250 MG PO TB24: 500.0000 mg | ORAL_TABLET | Freq: Every day | ORAL | 0 refills | Status: DC

## 2017-10-23 NOTE — Assessment & Plan Note (Signed)
Assessment/Plan/Recommendations: Laura Frank is experiencing symptoms of severe anxiety (GAD7 = 19, extremely difficult) and moderately severe depression (PHQ9 = 16, no SI, extremely difficult). She mentioned having difficulty with spending too much money and impulse control so I administered the MDQ, which was positive for 11 symptoms of Bipolar Disorder (felt so good, irritable, more self-confident, racing thoughts, easily distracted, more energy, more active, more social, sex, risky, spending money). Laura Frank reported that her maternal aunt had Bipolar Disorder and committed suicide. (Her paternal uncle also committed suicide). She denies any SI now or in the past. The patient also denied any prior or current non-suicidal self-injury (e.g., cutting). Laura Frank is not seeing any reduction in anxiety symptoms with resuming Zoloft, so this, coupled with her positive MDQ screen, could indicate that a change in medication is needed. Laura Frank will practice her DB, PMR and other emotion regulation strategies. We discussed the importance of discussing large purchases and other major decisions with her partner and/or mother/sister before taking action. She will return on April 29 at 9:30 am

## 2017-10-23 NOTE — Patient Instructions (Signed)
It was great seeing you today! We have addressed the following issues today  Mood issue: We added a new medication called Depakote.  Start release 2 capsules daily.  You may increase to 3 capsules in 3 days if no improvement.  Follow-up in 2 weeks or sooner if needed.   If we did any lab work today, and the results require attention, either me or my nurse will get in touch with you. If everything is normal, you will get a letter in mail and a message via . If you don't hear from us in two weeks, please give us a call. Otherwise, we look forward to seeing you again at your next visit. If you have any questions or concerns before then, please call the clinic at [phone].  Please bring all your medications to every doctors visit  Sign up for My Chart to have easy access to your labs results, and communication with your Primary care physician.    Please check-out at the front desk before leaving the clinic.    Take Care,   Dr. Alanda SlimGonfa

## 2017-10-23 NOTE — Progress Notes (Signed)
Subjective:    Laura Frank is a 27 y.o. old female here for follow up on mood issue  HPI Mood issue: patient with history of anxiety.  She was started on Zoloft about 6 months ago.  Today, she reports worsening of her symptoms.  She also reports increased irritability, anger and fluctuating moods spatially in the last 2 weeks.  She also reports vindicative behavior at work and concerned that this could cost her work.  She reports occasional marijuana use to treat her symptoms.  She denies suicidal or homicidal ideation.  She denies audiovisual hallucination.  [facility]  GAD-7 elevated to 19 with extreme difficulty.  PHQ 9 elevated to 16 extremity difficulty.  MDQ concerning for bipolar disorder with positive response for 11 symptoms of Mania.  She is not interested in mood stabilizer.  Patient reports family history of bipolar disorder in her uncle who committed suicide.  PMH/Problem List: has Generalized anxiety disorder; History of marijuana use; Viral URI; Episodic mood disorder (HCC); and Bipolar disorder, current episode mixed, moderate (HCC) on their problem list.   has a past medical history of Cyclic vomiting syndrome (01/05/2015), Generalized anxiety disorder, Marijuana use, and Panic attacks.  FH:  Family History  Problem Relation Age of Onset  . Alcohol abuse Neg Hx   . Arthritis Neg Hx   . Asthma Neg Hx   . Birth defects Neg Hx   . Cancer Neg Hx   . COPD Neg Hx   . Depression Neg Hx   . Diabetes Neg Hx   . Drug abuse Neg Hx   . Early death Neg Hx   . Hearing loss Neg Hx   . Heart disease Neg Hx   . Hyperlipidemia Neg Hx   . Hypertension Neg Hx   . Kidney disease Neg Hx   . Learning disabilities Neg Hx   . Mental illness Neg Hx   . Mental retardation Neg Hx   . Miscarriages / Stillbirths Neg Hx   . Stroke Neg Hx   . Vision loss Neg Hx   . Varicose Veins Neg Hx     SH Social History   Tobacco Use  . Smoking status: Never Smoker  .  Smokeless tobacco: Never Used  Substance Use Topics  . Alcohol use: No  . Drug use: Yes    Types: Marijuana    Comment: not currently    Review of Systems Review of systems negative except for pertinent positives and negatives in history of present illness above.     Objective:     Vitals:   10/23/17 1108  BP: (!) 98/56  Pulse: 67  Temp: 98.4 F (36.9 C)  TempSrc: Oral  SpO2: 99%  Weight: 145 lb (65.8 kg)  Height: 5\' 3"  (1.6 m)   Body mass index is 25.69 kg/m.  Physical Exam  GEN: appears well & comfortable. No apparent distress. HEM: negative for cervical or periauricular lymphadenopathies CVS: RRR, nl s1 & s2, no murmurs, no edema RESP: no IWOB, good air movement bilaterally, CTAB SKIN: no apparent skin lesion ENDO: negative thyromegally NEURO: alert and oiented appropriately, no gross deficits   PSYCH: appropriately dressed. Maintains good eye contact and is cooperative and attentive. Speech is normal volume and rate. Appropriate affect. Thought process is logical and goal directed. No suicidal or homicidal ideation. Does not appear to be responding to any internal stimuli. Able to maintain train of thought and concentrate on the questions. Depression screen [facility] 2/9  10/23/2017 07/13/2017 05/30/2017 05/30/2017 05/04/2017  Decreased Interest 1 0 0 0 0  Down, Depressed, Hopeless 2 0 0 0 0  PHQ - 2 Score 3 0 0 0 0  Altered sleeping 3 0 1 - -  Tired, decreased energy 2 0 1 - -  Change in appetite 2 0 1 - -  Feeling bad or failure about yourself  2 0 0 - -  Trouble concentrating 3 0 1 - -  Moving slowly or fidgety/restless 1 0 1 - -  Suicidal thoughts 0 0 0 - -  PHQ-9 Score 16 0 5 - -  Difficult doing work/chores Extremely dIfficult - - - -   GAD 7 : Generalized Anxiety Score 10/23/2017 05/30/2017 05/05/2017  Nervous, Anxious, on Edge 3 2 3   Control/stop worrying 3 2 3   Worry too much - different things 3 2 3   Trouble relaxing 3 2 2   Restless 3 1 2   Easily annoyed or  irritable 3 2 3   Afraid - awful might happen 1 1 2   Total GAD 7 Score 19 12 18   Anxiety Difficulty Extremely difficult - Very difficult   MDQ with positive response for 11 symptoms. Assessment and Plan:  1. Bipolar disorder, current episode mixed, moderate (HCC): patient with episodic fluctuating mood issue suggestive for bipolar disorder.  She was on Zoloft 50 mg for anxiety which could have unmasked her manic symptoms.  Will start Depakote ER 500 mg daily.  Recommended increasing to 750 mg daily after 3 days if no improvement in his symptoms.  We may need to continue titrating this up depending on her response.  She has IUD for birth control.  Follow-up in 2 weeks or sooner if needed.  She has a follow-up with [facility] next week.  We will check a Depakote level at that time.  We will continue Zoloft for depression and anxiety. - divalproex (DEPAKOTE ER) 250 MG 24 hr tablet; Take 2 tablets (500 mg total) by mouth daily. May increase to 3 tablets (750 mg) after three days  Dispense: 90 tablet; Refill: 0  Return in about 2 weeks (around 11/06/2017) for Mood issue.  Almon Hercules, MD 10/23/17 Pager: [phone]

## 2017-10-23 NOTE — Patient Instructions (Addendum)
Laura Frank: It was good to meet you today. As we discussed, keep practicing your diaphragmatic breathing and progressive muscle relaxation. Use purposeful delay (take a walk, wait to reply) when needed in tough situations. Use "I" statements when talking with others in tough situations. Try to examine your statements from the other person's point of view. Please call Dr. Alanda SlimGonfa to discuss your medication. Return to see me in one week on April 29 at 9:30 am Have a good week.  [name].

## 2017-10-23 NOTE — Progress Notes (Signed)
Reason for follow-up:  Laura Frank is here to discuss her symptoms of anxiety.  Issues discussed:  The patient is taking Zoloft but not feeling any relief from her symptoms of anxiety. She finds it difficult to practice PMR and DB. She had a difficult day at work on Friday and is worried that she will lose her job because of her actions that day.We discussed emotion regulation and interpersonal effectiveness strategies, such as counting to 10, taking a walk, and purposeful delay generally. She has been self-medicating some with marijuana so we discussed research concerning marijuana and its effect on anxiety (makes it worse).

## 2017-10-24 NOTE — Telephone Encounter (Signed)
[facility] Run under [facility], the rx is not covered as patient has family planning medicaid. [facility] and rx is covered with a $79 copay. PCP informed. Ples SpecterAlisa Juleon Narang, RN [facility] RN)

## 2017-10-30 ENCOUNTER — Ambulatory Visit: Payer: Medicaid Other | Admitting: Psychology

## 2017-10-30 DIAGNOSIS — F3162 Bipolar disorder, current episode mixed, moderate: Secondary | ICD-10-CM

## 2017-10-30 NOTE — Assessment & Plan Note (Signed)
Assessment/Plan/Recommendation: Laura Frank is finding the Depakote very helpful and has increased her dosage from 500 mg to 750 mg. She sets an alarm for 1 pm each day so that she can remember to take her medication. Her partner relapsed (alcohol) so this is a stressor for her. We discussed strategies for helping her partner stay sober (no drinking in the home, fun sober activities). I also provided her with referral information for the [facility], as her partner may benefit from this. He has only been relying upon AA thus far. I also suggested that Shiah speak with the [facility] concerning their DBT group. She may benefit from DBT emotion regulation and interpersonal effectiveness skills training. She will return in one week on May 6 at 8:30 am

## 2017-10-30 NOTE — Patient Instructions (Signed)
Lindey: it was great to see today. I am glad that the medication is helping you.  Remember to proceed slowly with major decisions. Try to talk things out with your partner, mom, sister etc. Consider calling the [facility] for treatment for your partner and DBT group for you. I will see you on Monday, May 6 at 8:30. Have a great week!  -[facility]

## 2017-10-30 NOTE — Progress Notes (Signed)
Reason for follow-up:  Laura Frank returns to discuss her symptoms stemming for Bipolar Disorder.  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 Her duties, responsibilities and hours have been reduced to allow her to be less stressed. She is comfortable with this new arrangement and feels that things are going better at work.

## 2017-11-06 ENCOUNTER — Ambulatory Visit: Payer: Medicaid Other | Admitting: Psychology

## 2017-11-06 DIAGNOSIS — F3162 Bipolar disorder, current episode mixed, moderate: Secondary | ICD-10-CM

## 2017-11-06 NOTE — Assessment & Plan Note (Signed)
Assessment/Plan/Recommendations: Laura Frank is experiencing moderate symptoms of anxiety (GAD7 = 10, somewhat difficult) and minimal symptoms of depression (PHQ9 = 3, no SI, not difficult at all). She feels that her symptoms are improving with the Depakote. Her work situation is going well, but her reduced hours have made it difficult for her financially so she would like to return to full-time work. I discussed social rhythm therapy research with Laura Frank and explained the importance of keeping to a schedule. I agreed that full-time work and less financial stress may be helpful to her, as she identified her finances as the thing causing her the most trouble right now. I also provided her with additional information regarding resources for her partner who is struggling with addiction. Laura Frank informed me that her sister was recently diagnosed with Bipolar Disorder, Type II, with acute anxiety. She asked to discuss this at her next session will be May 20 at 8:30 am.

## 2017-11-06 NOTE — Progress Notes (Signed)
Reason for follow-up:  Laura Frank is here to discuss her Bipolar Disorder symptoms and stressors.  Issues discussed:  Laura Frank recently saw her father from whom she has been estranged. She was able to maintain her composure with him, which is an improvement. She feels that her anxiety symptoms have reduced since starting Depakote.

## 2017-11-06 NOTE — Patient Instructions (Addendum)
[place]: It was great to see you today. As we discussed, I think it would be very beneficial to you to increase your work hours to decrease your financial stressors. It will also be helpful to you to stick to a schedule each day. I am glad that you are doing so well. Return to see me in two weeks on May 20 at 8:30 am. Keep up the good work.   [facility]

## 2017-11-10 ENCOUNTER — Encounter: Payer: Self-pay | Admitting: Student

## 2017-11-10 ENCOUNTER — Ambulatory Visit (INDEPENDENT_AMBULATORY_CARE_PROVIDER_SITE_OTHER): Payer: Self-pay | Admitting: Student

## 2017-11-10 VITALS — BP 99/60 | HR 86 | Temp 98.4°F | Ht 63.0 in | Wt 137.2 lb

## 2017-11-10 DIAGNOSIS — Z5181 Encounter for therapeutic drug level monitoring: Secondary | ICD-10-CM

## 2017-11-10 DIAGNOSIS — F39 Unspecified mood [affective] disorder: Secondary | ICD-10-CM

## 2017-11-10 NOTE — Patient Instructions (Addendum)
It was great seeing you today! We have addressed the following issues today  1. Mood issue: We are checking your blood levels today.  Will get in touch with you with the results next week   If we did any lab work today, and the results require attention, either me or my nurse will get in touch with you. If everything is normal, you will get a letter in mail and a message via . If you don't hear from [place] in two weeks, please give [place] a call. Otherwise, we look forward to seeing you again at your next visit. If you have any questions or concerns before then, please call the clinic at [phone].  Please bring all your medications to every doctors visit  Sign up for My Chart to have easy access to your labs results, and communication with your Primary care physician.    Please check-out at the front desk before leaving the clinic.    Take Care,   Dr. Alanda Slim

## 2017-11-10 NOTE — Progress Notes (Signed)
Subjective:    Laura Frank is a 27 y.o. old female here for follow up on mood issue  HPI  Mood issue: states that she is "doing very well". She reports good compliance with Depakote. She is on 750 mg ER daily. She reports tolerating it well. She is also on Zoloft 50 mg daily. She reports "a lot of stability and appropriate response to stress". She says she has been around her parents more that usual which has been helpful. She says she has been able to function well and handle things at home and work. However, she says she had a drastic mood shift yesterday. She got home from work last night about 8 pm and she just shifted to sadness and low suddenly. She says she was hungry.She felt her energy level was low.  She also felt her partner should have checked on her. She also had a nightmare about work. She says she is calm this morning.  Last dose on her Depakote is about 20 hours ago. She denies SI/HI. While reviewing her MDQ, she reported Marijuana use about week ago when her mood was a little bit high.   PMH/Problem List: has Generalized anxiety disorder; History of marijuana use; Viral URI; Episodic mood disorder ([place]); Bipolar disorder, current episode mixed, moderate ([place]); and Encounter for therapeutic drug monitoring on their problem list.   has a past medical history of Cyclic vomiting syndrome (01/05/2015), Generalized anxiety disorder, Marijuana use, and Panic attacks.  FH:  Family History  Problem Relation Age of Onset  . Alcohol abuse Neg Hx   . Arthritis Neg Hx   . Asthma Neg Hx   . Birth defects Neg Hx   . Cancer Neg Hx   . COPD Neg Hx   . Depression Neg Hx   . Diabetes Neg Hx   . Drug abuse Neg Hx   . Early death Neg Hx   . Hearing loss Neg Hx   . Heart disease Neg Hx   . Hyperlipidemia Neg Hx   . Hypertension Neg Hx   . Kidney disease Neg Hx   . Learning disabilities Neg Hx   . Mental illness Neg Hx   . Mental retardation Neg Hx   . Miscarriages / Stillbirths Neg Hx   .  Stroke Neg Hx   . Vision loss Neg Hx   . Varicose Veins Neg Hx     SH Social History   Tobacco Use  . Smoking status: Never Smoker  . Smokeless tobacco: Never Used  Substance Use Topics  . Alcohol use: No  . Drug use: Yes    Types: Marijuana    Comment: not currently    Review of Systems Review of systems negative except for pertinent positives and negatives in history of present illness above.     Objective:     Vitals:   11/10/17 0919  BP: 99/60  Pulse: 86  Temp: 98.4 F (36.9 C)  TempSrc: Oral  SpO2: 95%  Weight: 137 lb 3.2 oz (62.2 kg)  Height: _0  (1.6 m)   Body mass index is 24.3 kg/m.  Physical Exam  GEN: appears well & comfortable. No apparent distress. CVS: RRR, nl s1 & s2, no murmurs RESP: no IWOB, good air movement bilaterally, CTAB SKIN: no apparent skin lesion ENDO: negative thyromegally NEURO: alert and oiented appropriately, no gross deficits   PSYCH: neatly groomed and appropriately dressed. Maintains good eye contact and is cooperative and attentive. Speech is normal volume and rate. Mood  is "good" with congruent affect. Thought process is logical and goal directed. No suicidal or homicidal ideation. Does not appear to be responding to any internal stimuli. Able to maintain train of thought and concentrate on the questions. Cognitive ability average. Depression screen [facility] 2/9 11/10/2017 11/06/2017 10/23/2017 07/13/2017 05/30/2017  Decreased Interest 0 0 1 0 0  Down, Depressed, Hopeless 0 1 2 0 0  PHQ - 2 Score 0 1 3 0 0  Altered sleeping 0 2 3 0 1  Tired, decreased energy 0 0 2 0 1  Change in appetite 0 0 2 0 1  Feeling bad or failure about yourself  0 0 2 0 0  Trouble concentrating 0 0 3 0 1  Moving slowly or fidgety/restless 0 0 1 0 1  Suicidal thoughts 0 - 0 0 0  PHQ-9 Score 0 3 16 0 5  Difficult doing work/chores - Not difficult at all Extremely dIfficult - -   GAD 7 : Generalized Anxiety Score 11/10/2017 11/06/2017 10/23/2017 05/30/2017    Nervous, Anxious, on Edge 0 _0 Control/stop worrying 0 _1 Worry too much - different things _2 Trouble relaxing _3 Restless _4 Easily annoyed or irritable 0 _5 Afraid - awful might happen 0 _6 Total GAD 7 Score _7 Anxiety Difficulty Somewhat difficult Somewhat difficult Extremely difficult -   MDQ: Responded yes to all the questions except sleep. Yes to item 2-5. She scored 23 on  BSDS. However, she says the questionnaire is not time specific and her response may not reflect her situation in the last 7 days.     Assessment and Plan:  1. Mood disorder [facility]): her response to screening tools today concerning for suboptimally controlled manic features. She responded yes to all the items 1-5 except sleep on MDQ.  She scored 23 on  BSDS. However, she says the questionnaire is not time specific and her response may not reflect her situation in the last 7 days. She is much calmer this week. PHQ 9 is 0.  Her GAD 7 is 3.  -Will continue Depakote ER 750 mg daily and Zoloft 50 mg daily -Will check Depakote level today. Last dose about 20 hours ago -May increase dose depending on level -Discussed the impact of marijuana use  2. Encounter for therapeutic drug monitoring - CMP14+EGFR - CBC With Differential - Valproic acid level   Return in about 3 weeks (around 12/01/2017) for Mood issue.  Mercy Riding, MD 11/12/17 Pager: [phone]

## 2017-11-11 LAB — CMP14+EGFR
A/G RATIO: 1.8 (ref 1.2–2.2)
ALT: 17 IU/L (ref 0–32)
AST: 23 IU/L (ref 0–40)
Albumin: 4.6 g/dL (ref 3.5–5.5)
Alkaline Phosphatase: 86 IU/L (ref 39–117)
BUN/Creatinine Ratio: 10 (ref 9–23)
BUN: 8 mg/dL (ref 6–20)
Bilirubin Total: 0.4 mg/dL (ref 0.0–1.2)
CALCIUM: 10 mg/dL (ref 8.7–10.2)
CHLORIDE: 102 mmol/L (ref 96–106)
CO2: 24 mmol/L (ref 20–29)
Creatinine, Ser: 0.84 mg/dL (ref 0.57–1.00)
GFR calc Af Amer: 110 mL/min/{1.73_m2} (ref >59)
GFR, EST NON AFRICAN AMERICAN: 96 mL/min/{1.73_m2} (ref >59)
GLOBULIN, TOTAL: 2.5 g/dL (ref 1.5–4.5)
Glucose: 87 mg/dL (ref 65–99)
Potassium: 4.4 mmol/L (ref 3.5–5.2)
Sodium: 142 mmol/L (ref 134–144)
Total Protein: 7.1 g/dL (ref 6.0–8.5)

## 2017-11-11 LAB — CBC WITH DIFFERENTIAL
BASOS ABS: 0 10*3/uL (ref 0.0–0.2)
Basos: 0 %
EOS (ABSOLUTE): 0 10*3/uL (ref 0.0–0.4)
EOS: 0 %
HEMOGLOBIN: 14.8 g/dL (ref 11.1–15.9)
Hematocrit: 43.9 % (ref 34.0–46.6)
IMMATURE GRANS (ABS): 0 10*3/uL (ref 0.0–0.1)
IMMATURE GRANULOCYTES: 0 %
LYMPHS: 33 %
Lymphocytes Absolute: 2 10*3/uL (ref 0.7–3.1)
MCH: 30 pg (ref 26.6–33.0)
MCHC: 33.7 g/dL (ref 31.5–35.7)
MCV: 89 fL (ref 79–97)
MONOCYTES: 5 %
Monocytes Absolute: 0.3 10*3/uL (ref 0.1–0.9)
Neutrophils Absolute: 3.7 10*3/uL (ref 1.4–7.0)
Neutrophils: 62 %
RBC: 4.94 x10E6/uL (ref 3.77–5.28)
RDW: 13.8 % (ref 12.3–15.4)
WBC: 6 10*3/uL (ref 3.4–10.8)

## 2017-11-11 LAB — VALPROIC ACID LEVEL: VALPROIC ACID LVL: 67 ug/mL (ref 50–100)

## 2017-11-12 ENCOUNTER — Encounter: Payer: Self-pay | Admitting: Student

## 2017-11-12 DIAGNOSIS — Z5181 Encounter for therapeutic drug level monitoring: Secondary | ICD-10-CM | POA: Insufficient documentation

## 2017-11-20 ENCOUNTER — Encounter: Payer: Self-pay | Admitting: Student

## 2017-11-20 ENCOUNTER — Ambulatory Visit: Payer: Medicaid Other | Admitting: Psychology

## 2017-11-20 DIAGNOSIS — F3162 Bipolar disorder, current episode mixed, moderate: Secondary | ICD-10-CM

## 2017-11-20 NOTE — Progress Notes (Signed)
Reason for follow-up:  Laura Frank returns for follow-up regarding her symptoms of Bipolar Disorder.  Issues discussed:  Laura Frank reported feeling upset by family's posts on [employer] concerning abortion and politics. We discussed research showing that social media tends to make people feel worse rather better; I asked whether Laura Frank feels that she should take a break from [employer]. This is something that she will consider. She also felt upset that her partner did not do anything special for her on Mother's Day. We discussed the Seven Love Languages and how she may need to communicate her needs more clearly using "I" statements ("I felt a little disappointed that you did not give me a Mother's Day card").

## 2017-11-20 NOTE — Assessment & Plan Note (Signed)
Assessment/Plan/Recommendations: Laura Frank is still experiencing severe anxiety (GAD7 = 15, very difficult) and moderately severe depression (PHQ9 = 16, very difficult, no SI). Although she initially felt that her Depakote (750 mg, no missed doses, taken same time each day) was making a difference, she is now not sure. She has been very moody, cycling from depressed to irritable to happy several times each day. The patient normally keeps a regular sleep schedule, but her baby has been sick, causing her to stay home with him and sleep more -- as much as 16 hours per day. She feels that she is very easily triggered to anger. Laura Frank is keeping a mood journal; I encouraged this and also told her about the GLAD technique (noting gratitude, learning, accomplishment, and delight each day). She will return in two weeks on June 3 at 9 am.

## 2017-11-20 NOTE — Patient Instructions (Addendum)
Arissa: It was a pleasure to see you today. As we discussed, the book regarding Love Languages may be useful to you. I love that you are keeping a mood journal. Also, try using the GLAD technique: Gratitude (something you are grateful for) Learning (something you learned that day) Accomplishment (something you accomplished that day) Delight (something that gave you delight)  Return to see me on June 3 at 9 am  --Delfin Edis [facility]

## 2017-11-21 ENCOUNTER — Telehealth: Payer: Self-pay

## 2017-11-21 ENCOUNTER — Other Ambulatory Visit: Payer: Self-pay | Admitting: Student

## 2017-11-21 DIAGNOSIS — F411 Generalized anxiety disorder: Secondary | ICD-10-CM

## 2017-11-21 DIAGNOSIS — F3162 Bipolar disorder, current episode mixed, moderate: Secondary | ICD-10-CM

## 2017-11-21 MED ORDER — DIVALPROEX SODIUM ER 250 MG PO TB24: 1000.0000 mg | ORAL_TABLET | Freq: Every day | ORAL | 0 refills | Status: DC

## 2017-11-21 MED ORDER — SERTRALINE HCL 100 MG PO TABS: 100.0000 mg | ORAL_TABLET | Freq: Every day | ORAL | 0 refills | Status: DC

## 2017-11-21 NOTE — Progress Notes (Signed)
Increased Depakote to 1000 mg and Zoloft to 100 mg

## 2017-11-21 NOTE — Telephone Encounter (Signed)
Received fax from [facility] pharmacy requesting prior authorization of Depakote. Patient has medicaid family planning which which does not cover this type of medication. Pharmacy informed. Shawna Orleans, RN

## 2017-11-21 NOTE — Telephone Encounter (Signed)
I think patient has private insurance. We ran in to similar problem in the past, & patient got it filled at [facility] pharmacy for reasonable copay.  Thank you, Alwyn Ren

## 2017-12-01 ENCOUNTER — Other Ambulatory Visit: Payer: Self-pay | Admitting: Student

## 2017-12-01 DIAGNOSIS — F3162 Bipolar disorder, current episode mixed, moderate: Secondary | ICD-10-CM

## 2017-12-01 MED ORDER — DIVALPROEX SODIUM ER 500 MG PO TB24: 1000.0000 mg | ORAL_TABLET | Freq: Every day | ORAL | 0 refills | Status: DC

## 2017-12-04 ENCOUNTER — Ambulatory Visit: Payer: Medicaid Other | Admitting: Psychology

## 2017-12-04 DIAGNOSIS — F3162 Bipolar disorder, current episode mixed, moderate: Secondary | ICD-10-CM
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
## 2017-12-11 ENCOUNTER — Ambulatory Visit: Payer: Self-pay | Admitting: Psychology

## 2017-12-11 DIAGNOSIS — F3162 Bipolar disorder, current episode mixed, moderate: Secondary | ICD-10-CM
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
## 2017-12-11 NOTE — Patient Instructions (Addendum)
Barri: It was good to see you today. I am glad that you are finding medication helpful. Also, great that you are sleeping well and keeping to a schedule. Keep up the good work. I have given you information concerning Al-Anon; I hope that this is helpful. Also consider encouraging your partner to attend an AA meeting. He may like the meeting on Friday noon at [address], [place] [employer]. Please take steps to keep you and your son safe. Return to [facility] as needed.
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
## 2017-12-25 ENCOUNTER — Other Ambulatory Visit: Payer: Self-pay | Admitting: Student

## 2017-12-25 DIAGNOSIS — F411 Generalized anxiety disorder: Secondary | ICD-10-CM

## 2017-12-25 MED ORDER — SERTRALINE HCL 100 MG PO TABS: 100.0000 mg | ORAL_TABLET | Freq: Every day | ORAL | 0 refills | Status: AC

## 2018-01-12 ENCOUNTER — Encounter

## 2018-03-19 ENCOUNTER — Telehealth: Payer: Self-pay | Admitting: Psychology
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
## 2018-04-10 IMAGING — US US ABDOMEN COMPLETE
1 series · 15 of 25 positions shown · non-contrast
Comparison: None.

CLINICAL DATA: Elevated liver enzymes. Palpable fullness left lower
quadrant. Third trimester gestation.

EXAM:
ABDOMEN ULTRASOUND COMPLETE

[Series 1: us abdomen complete · 15 of 109 slices shown]
[im 1/109]
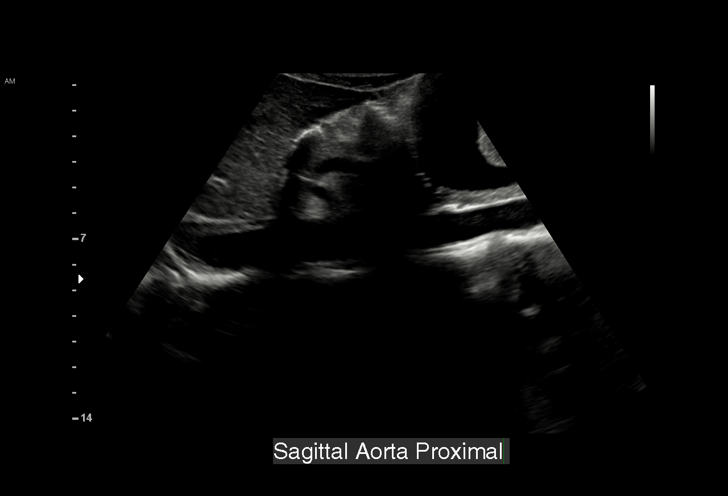
[im 10/109]
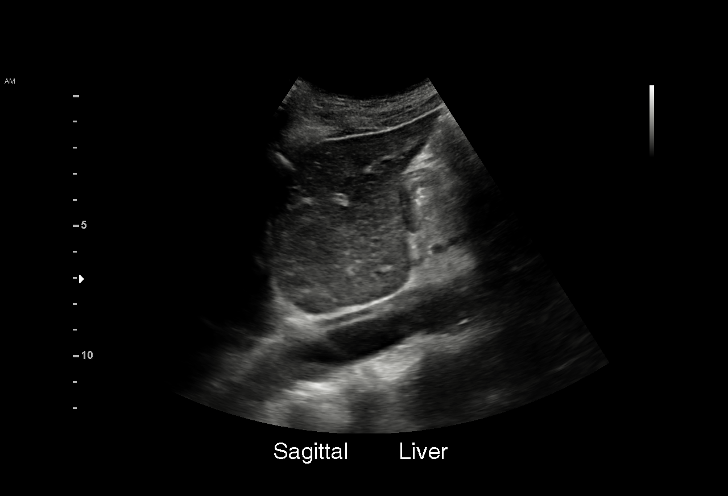
[im 19/109]
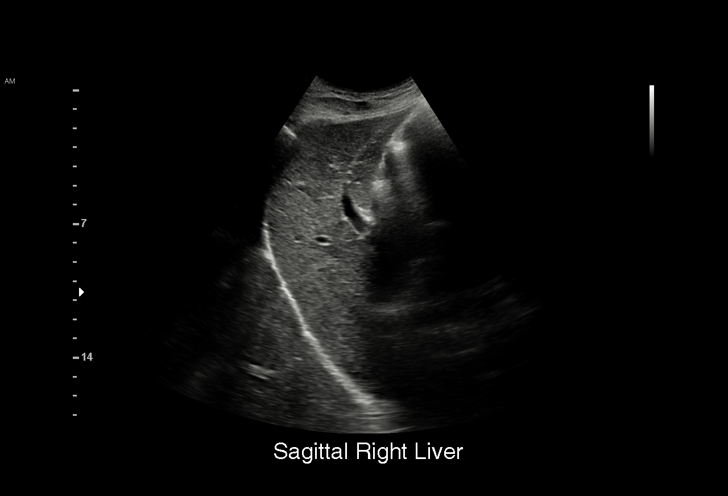
[im 23/109]
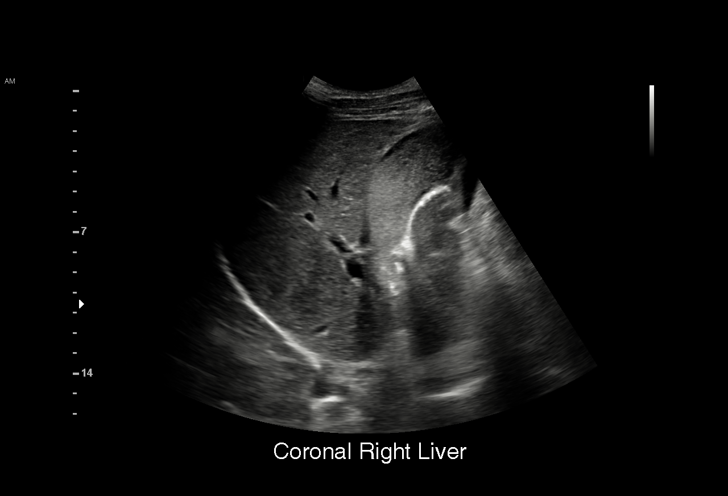
[im 32/109]
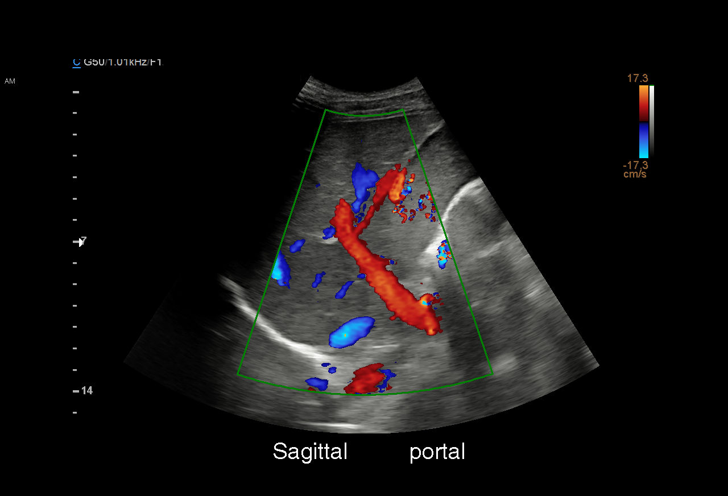
[im 41/109]
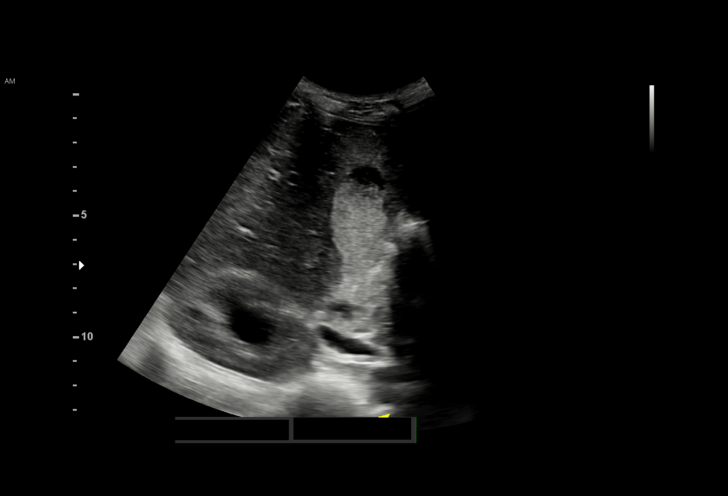
[im 46/109]
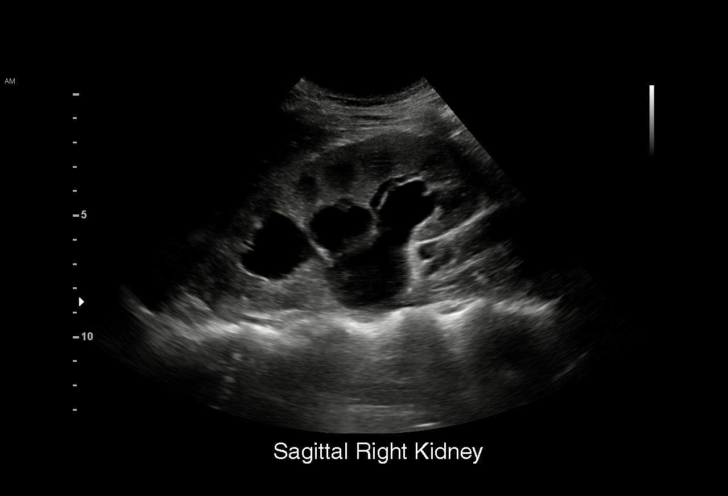
[im 55/109]
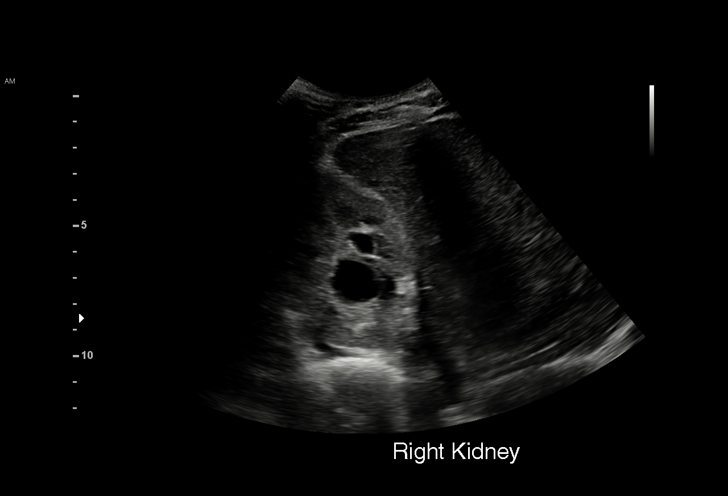
[im 64/109]
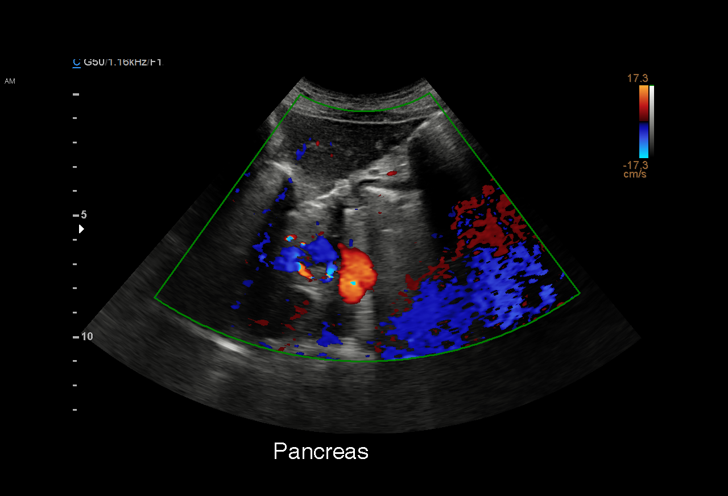
[im 68/109]
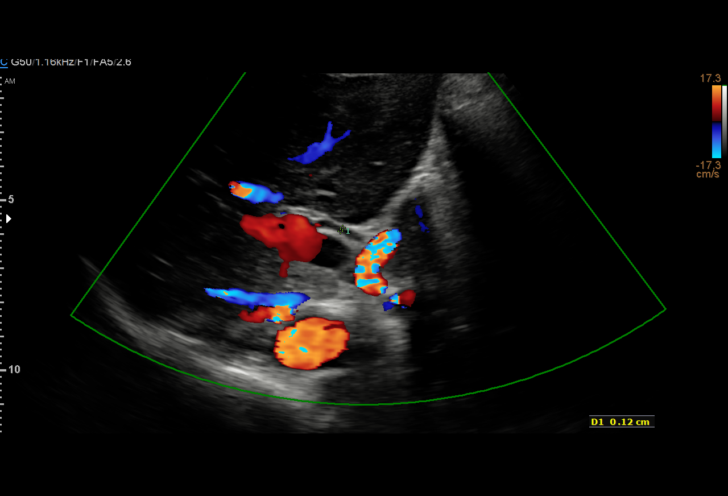
[im 77/109]
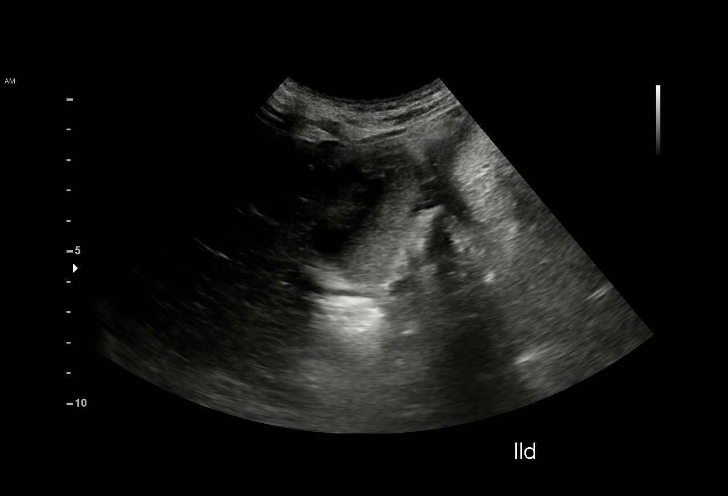
[im 86/109]
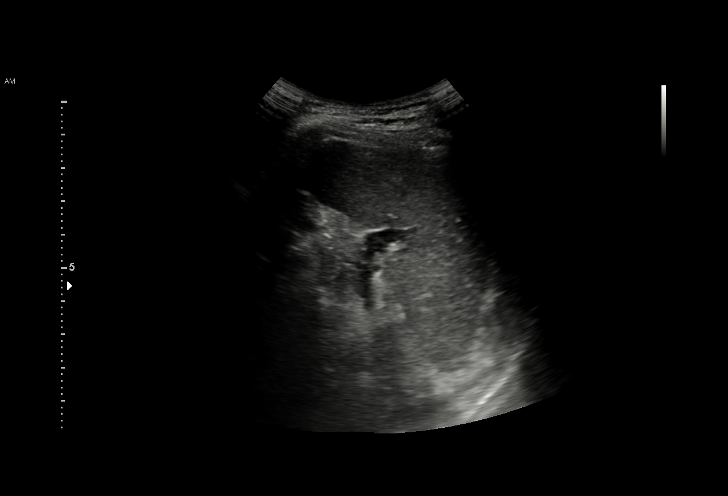
[im 91/109]
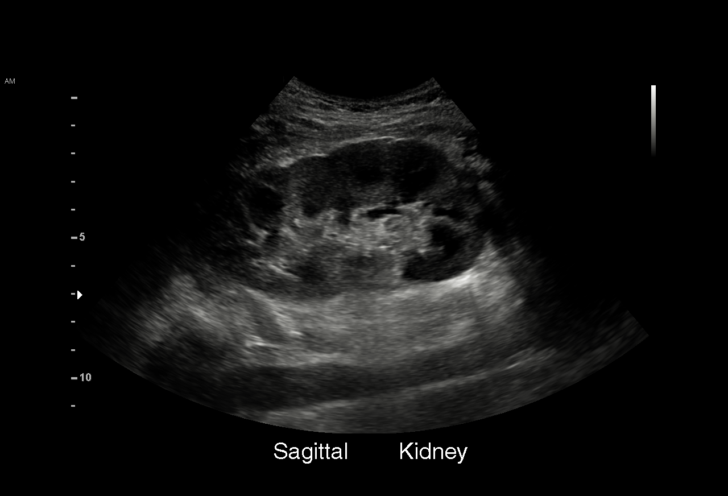
[im 100/109]
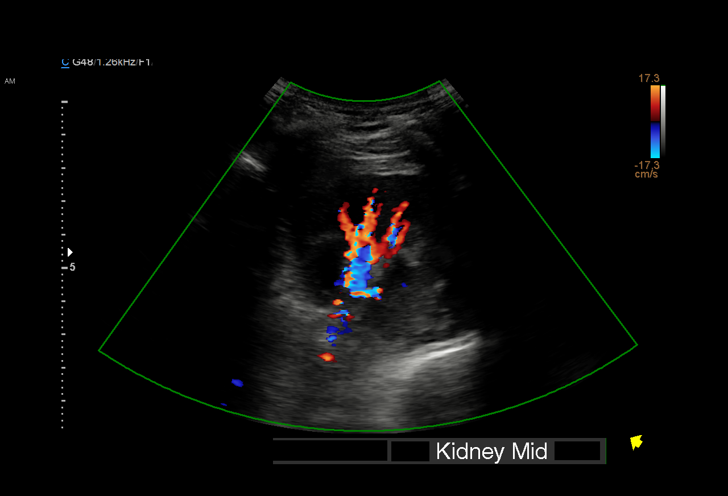
[im 109/109]
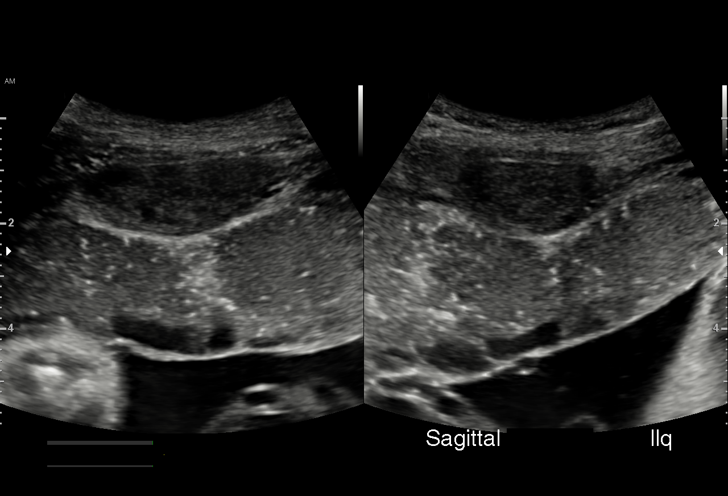

[15 of 25 positions shown; findings below may reference images not displayed]

FINDINGS: Gallbladder: There is extensive sludge within the gallbladder. Also
within the gallbladder, there are intermingled small shadowing
echogenic foci which are felt to represent small gallstones. There
is no gallbladder wall thickening or pericholecystic fluid. No
sonographic Murphy sign noted by sonographer.

Common bile duct: Diameter: 2 mm. There is no intrahepatic, common
hepatic, or common bile duct dilatation.

Liver: No focal lesion identified. Within normal limits in
parenchymal echogenicity.

IVC: No abnormality visualized.

Pancreas: Visualized portion unremarkable. Portions of pancreas
obscured by gas.

Spleen: Size and appearance within normal limits.

Right Kidney: Length: 12.6 cm. Echogenicity within normal limits. No
mass visualized. There is moderate hydronephrosis on the right.

Left Kidney: Length: 11.9 cm. Echogenicity within normal limits. No
mass or hydronephrosis visualized.

Abdominal aorta: No aneurysm visualized.

Other findings: No demonstrable ascites.

In the area of palpable fullness in the left lower quadrant, there
is a hypoechoic structure which appears to the uterus measuring
x 1.4 cm, a questionable leiomyoma.
IMPRESSION: Extensive sludge in gallbladder small intermingled gallstones. No
gallbladder wall thickening or pericholecystic fluid.

Moderate hydronephrosis on the right without obstructing focus
evident. This hydronephrosis potentially could be a result pressure
on the right ureter from the third trimester gestation.

Portions of pancreas obscured by gas. Visualized portions pancreas
appear normal.

Hypoechoic structure in the uterus in the area of palpable fullness
in the left lower quadrant. Suspect uterine leiomyoma.

Study otherwise unremarkable.

## 2018-04-12 ENCOUNTER — Other Ambulatory Visit: Payer: Self-pay

## 2018-04-12 DIAGNOSIS — F3162 Bipolar disorder, current episode mixed, moderate: Secondary | ICD-10-CM

## 2018-04-13 ENCOUNTER — Ambulatory Visit: Payer: Self-pay | Admitting: Family Medicine

## 2018-04-18 MED ORDER — DIVALPROEX SODIUM ER 500 MG PO TB24: 1000.0000 mg | ORAL_TABLET | Freq: Every day | ORAL | 0 refills | Status: AC

## 2021-12-07 ENCOUNTER — Encounter: Payer: Self-pay | Admitting: *Deleted
